# Patient Record
Sex: Female | Born: 1965 | Race: Black or African American | Hispanic: No | Marital: Married | Smoking: Never smoker
Health system: Southern US, Community
[De-identification: ages and names within clinical notes are randomized; demographics above are authoritative.]

## PROBLEM LIST (undated history)

## (undated) DIAGNOSIS — I10 Essential (primary) hypertension: Secondary | ICD-10-CM

## (undated) DIAGNOSIS — E785 Hyperlipidemia, unspecified: Secondary | ICD-10-CM

## (undated) HISTORY — PX: CARPAL TUNNEL RELEASE: SHX101

## (undated) HISTORY — PX: HYSTERECTOMY ABDOMINAL WITH SALPINGECTOMY: SHX6725

## (undated) HISTORY — PX: INTERCOSTAL NERVE BLOCK: SHX5021

## (undated) HISTORY — PX: TONSILLECTOMY AND ADENOIDECTOMY: SUR1326

---

## 2004-11-13 ENCOUNTER — Emergency Department: Payer: Self-pay | Admitting: Emergency Medicine

## 2008-01-27 ENCOUNTER — Observation Stay: Payer: Self-pay | Admitting: Internal Medicine

## 2008-01-27 ENCOUNTER — Other Ambulatory Visit: Payer: Self-pay

## 2008-11-29 ENCOUNTER — Ambulatory Visit: Payer: Self-pay | Admitting: Family Medicine

## 2009-04-08 ENCOUNTER — Emergency Department: Payer: Self-pay | Admitting: Emergency Medicine

## 2009-06-25 ENCOUNTER — Ambulatory Visit: Payer: Self-pay | Admitting: Specialist

## 2009-12-04 ENCOUNTER — Ambulatory Visit: Payer: Self-pay | Admitting: Family Medicine

## 2010-06-12 ENCOUNTER — Ambulatory Visit: Payer: Self-pay | Admitting: Specialist

## 2010-10-07 ENCOUNTER — Ambulatory Visit: Payer: Self-pay | Admitting: Otolaryngology

## 2010-11-03 ENCOUNTER — Ambulatory Visit: Payer: Self-pay | Admitting: Neurosurgery

## 2011-03-11 ENCOUNTER — Observation Stay: Payer: Self-pay | Admitting: Internal Medicine

## 2011-07-05 ENCOUNTER — Emergency Department: Payer: Self-pay | Admitting: Unknown Physician Specialty

## 2011-09-09 ENCOUNTER — Emergency Department: Payer: Self-pay | Admitting: Emergency Medicine

## 2011-09-23 ENCOUNTER — Emergency Department: Payer: Self-pay | Admitting: Unknown Physician Specialty

## 2012-04-13 ENCOUNTER — Emergency Department: Payer: Self-pay | Admitting: Emergency Medicine

## 2012-10-14 ENCOUNTER — Emergency Department: Payer: Self-pay | Admitting: Emergency Medicine

## 2012-10-14 LAB — URINALYSIS, COMPLETE
Ketone: NEGATIVE
Nitrite: NEGATIVE
Ph: 6 (ref 4.5–8.0)
Protein: NEGATIVE
Specific Gravity: 1.008 (ref 1.003–1.030)
Squamous Epithelial: 2

## 2012-10-14 LAB — BASIC METABOLIC PANEL
Anion Gap: 7 (ref 7–16)
BUN: 12 mg/dL (ref 7–18)
Calcium, Total: 9.2 mg/dL (ref 8.5–10.1)
Chloride: 106 mmol/L (ref 98–107)
Co2: 25 mmol/L (ref 21–32)
EGFR (Non-African Amer.): >60
Glucose: 83 mg/dL (ref 65–99)
Osmolality: 275 (ref 275–301)
Potassium: 3.7 mmol/L (ref 3.5–5.1)

## 2012-10-14 LAB — CBC
HGB: 13 g/dL (ref 12.0–16.0)
MCH: 31.7 pg (ref 26.0–34.0)
MCHC: 33.7 g/dL (ref 32.0–36.0)
MCV: 94 fL (ref 80–100)
Platelet: 278 10*3/uL (ref 150–440)
RBC: 4.11 10*6/uL (ref 3.80–5.20)
RDW: 14 % (ref 11.5–14.5)

## 2012-10-14 LAB — TROPONIN I
Troponin-I: <0.02 ng/mL
Troponin-I: <0.02 ng/mL

## 2012-10-14 LAB — CK TOTAL AND CKMB (NOT AT ARMC): CK, Total: 94 U/L (ref 21–215)

## 2014-05-22 ENCOUNTER — Emergency Department: Payer: Self-pay | Admitting: Emergency Medicine

## 2014-12-06 ENCOUNTER — Ambulatory Visit: Payer: Self-pay | Admitting: Family Medicine

## 2015-01-02 DIAGNOSIS — M5126 Other intervertebral disc displacement, lumbar region: Secondary | ICD-10-CM | POA: Insufficient documentation

## 2015-01-02 DIAGNOSIS — M5116 Intervertebral disc disorders with radiculopathy, lumbar region: Secondary | ICD-10-CM | POA: Insufficient documentation

## 2015-01-02 DIAGNOSIS — M5136 Other intervertebral disc degeneration, lumbar region: Secondary | ICD-10-CM | POA: Insufficient documentation

## 2015-09-25 ENCOUNTER — Other Ambulatory Visit: Payer: Self-pay | Admitting: Family Medicine

## 2015-09-25 DIAGNOSIS — Z1231 Encounter for screening mammogram for malignant neoplasm of breast: Secondary | ICD-10-CM

## 2015-10-08 ENCOUNTER — Ambulatory Visit
Admission: RE | Admit: 2015-10-08 | Discharge: 2015-10-08 | Disposition: A | Discharge status: Home / Self Care | Payer: Managed Care, Other (non HMO) | Source: Ambulatory Visit | Attending: Family Medicine | Admitting: Family Medicine

## 2015-10-08 DIAGNOSIS — Z1231 Encounter for screening mammogram for malignant neoplasm of breast: Secondary | ICD-10-CM | POA: Diagnosis not present

## 2016-01-31 ENCOUNTER — Ambulatory Visit (INDEPENDENT_AMBULATORY_CARE_PROVIDER_SITE_OTHER): Payer: Managed Care, Other (non HMO) | Admitting: Family Medicine

## 2016-01-31 ENCOUNTER — Encounter: Payer: Self-pay | Admitting: Family Medicine

## 2016-01-31 VITALS — BP 126/102 | HR 58 | Temp 98.3°F | Resp 16 | Wt 201.6 lb

## 2016-01-31 DIAGNOSIS — B349 Viral infection, unspecified: Secondary | ICD-10-CM | POA: Diagnosis not present

## 2016-01-31 DIAGNOSIS — J029 Acute pharyngitis, unspecified: Secondary | ICD-10-CM

## 2016-01-31 LAB — POC INFLUENZA A&B (BINAX/QUICKVUE)
INFLUENZA A, POC: NEGATIVE
Influenza B, POC: NEGATIVE

## 2016-01-31 LAB — POCT RAPID STREP A (OFFICE): RAPID STREP A SCREEN: NEGATIVE

## 2016-01-31 MED ORDER — OSELTAMIVIR PHOSPHATE 75 MG PO CAPS: 75.0000 mg | ORAL_CAPSULE | Freq: Two times a day (BID) | ORAL | Status: DC

## 2016-01-31 MED ORDER — AZITHROMYCIN 250 MG PO TABS: ORAL_TABLET | ORAL | Status: DC

## 2016-01-31 NOTE — Progress Notes (Signed)
Subjective:     Patient ID: Sherri Romero, female   DOB: 1966-06-08, 50 y.o.   MRN: [id number]  HPI  Chief Complaint  Patient presents with  . Sore Throat    Patient comes in office today with concerns of sore throat for the past 2 days, patient states that yesterday she had ran fever high of 101. Associated patient statres she has had congestion, pain with swallowing, spitting up bloody sputum and nose bleeds. Patient states that her household is sick with similar symptoms daughter initally had symptoms but was diagnosed with cold.   No flu shot due to prior anaphylactic reaction.  Reports significant body aches. Currently works at [employer].   Review of Systems     Objective:   Physical Exam  Constitutional: She appears well-developed and well-nourished. No distress.  Ears: T.M's intact without inflamma Throat: tonsils are absent with mild posterior pharyngeal erythema Neck: no cervical adenopathy Lungs: clear     Assessment:    1. Viral syndrome - oseltamivir (TAMIFLU) 75 MG capsule; Take 1 capsule (75 mg total) by mouth 2 (two) times daily.  Dispense: 10 capsule; Refill: 0 - POC Influenza A&B  2. Pharyngitis - azithromycin (ZITHROMAX Z-PAK) 250 MG tablet; Two pills the first day the one pill daily for four days  Dispense: 6 each; Refill: 0 - POCT rapid strep A    Plan:    discussed otc medication. To start Tamiflu in next 24 hours if significant cough. Work excuse for 2/17-2/19.

## 2016-01-31 NOTE — Patient Instructions (Addendum)
Discussed use of Mucinex D and Delsym for cough. Try ibuprofen or salt water gargles for sore throat. Start Tamiflu if significant cough in the next 24 hours.

## 2016-06-10 ENCOUNTER — Emergency Department
Admission: EM | Admit: 2016-06-10 | Discharge: 2016-06-10 | Disposition: A | Discharge status: Home / Self Care | Payer: Managed Care, Other (non HMO) | Attending: Emergency Medicine | Admitting: Emergency Medicine

## 2016-06-10 ENCOUNTER — Encounter: Payer: Self-pay | Admitting: Emergency Medicine

## 2016-06-10 DIAGNOSIS — I10 Essential (primary) hypertension: Secondary | ICD-10-CM | POA: Insufficient documentation

## 2016-06-10 DIAGNOSIS — Z952 Presence of prosthetic heart valve: Secondary | ICD-10-CM | POA: Insufficient documentation

## 2016-06-10 DIAGNOSIS — R55 Syncope and collapse: Secondary | ICD-10-CM | POA: Diagnosis present

## 2016-06-10 DIAGNOSIS — M5136 Other intervertebral disc degeneration, lumbar region: Secondary | ICD-10-CM | POA: Diagnosis not present

## 2016-06-10 HISTORY — DX: Essential (primary) hypertension: I10

## 2016-06-10 LAB — BASIC METABOLIC PANEL
Anion gap: 7 (ref 5–15)
BUN: 9 mg/dL (ref 6–20)
CHLORIDE: 105 mmol/L (ref 101–111)
CO2: 24 mmol/L (ref 22–32)
CREATININE: 0.81 mg/dL (ref 0.44–1.00)
Calcium: 8.9 mg/dL (ref 8.9–10.3)
GFR calc non Af Amer: >60 mL/min (ref >60)
Glucose, Bld: 118 mg/dL — ABNORMAL HIGH (ref 65–99)
POTASSIUM: 3.4 mmol/L — AB (ref 3.5–5.1)
Sodium: 136 mmol/L (ref 135–145)

## 2016-06-10 LAB — URINALYSIS COMPLETE WITH MICROSCOPIC (ARMC ONLY)
BILIRUBIN URINE: NEGATIVE
Bacteria, UA: NONE SEEN
GLUCOSE, UA: NEGATIVE mg/dL
Ketones, ur: NEGATIVE mg/dL
Leukocytes, UA: NEGATIVE
NITRITE: NEGATIVE
Protein, ur: NEGATIVE mg/dL
SPECIFIC GRAVITY, URINE: 1.001 — AB (ref 1.005–1.030)
Squamous Epithelial / LPF: NONE SEEN
pH: 7 (ref 5.0–8.0)

## 2016-06-10 LAB — CBC WITH DIFFERENTIAL/PLATELET
Basophils Absolute: 0.1 10*3/uL (ref 0–0.1)
Basophils Relative: 1 %
EOS PCT: 4 %
Eosinophils Absolute: 0.4 10*3/uL (ref 0–0.7)
HEMATOCRIT: 37.3 % (ref 35.0–47.0)
Hemoglobin: 12.9 g/dL (ref 12.0–16.0)
LYMPHS ABS: 3.9 10*3/uL — AB (ref 1.0–3.6)
LYMPHS PCT: 43 %
MCH: 32.1 pg (ref 26.0–34.0)
MCHC: 34.6 g/dL (ref 32.0–36.0)
MCV: 92.8 fL (ref 80.0–100.0)
Monocytes Absolute: 0.7 10*3/uL (ref 0.2–0.9)
Monocytes Relative: 8 %
NEUTROS ABS: 3.9 10*3/uL (ref 1.4–6.5)
Neutrophils Relative %: 44 %
PLATELETS: 261 10*3/uL (ref 150–440)
RBC: 4.02 MIL/uL (ref 3.80–5.20)
RDW: 13.5 % (ref 11.5–14.5)
WBC: 8.9 10*3/uL (ref 3.6–11.0)

## 2016-06-10 LAB — TROPONIN I
Troponin I: <0.03 ng/mL (ref <0.03)
Troponin I: <0.03 ng/mL (ref <0.03)

## 2016-06-10 LAB — POCT RAPID STREP A: Streptococcus, Group A Screen (Direct): NEGATIVE

## 2016-06-10 NOTE — Discharge Instructions (Signed)
You were evaluated after nearly passing out, and your exam and evaluation are reassuring in the emergency department today.  Return to emergency department immediately for any new or worsening condition including chest pain, palpitations, dizziness or passing out, weakness or numbness, confusion or altered mental status, or any other symptoms concerning to you.   Near-Syncope Near-syncope (commonly known as near fainting) is sudden weakness, dizziness, or feeling like you might pass out. This can happen when getting up or while standing for a long time. It is caused by a sudden decrease in blood flow to the brain, which can occur for various reasons. Most of the reasons are not serious.  HOME CARE Watch your condition for any changes.  Have someone stay with you until you feel stable.  If you feel like you are going to pass out:  Lie down right away.  Prop your feet up if you can.  Breathe deeply and steadily.  Move only when the feeling has gone away. Most of the time, this feeling lasts only a few minutes. You may feel tired for several hours.  Drink enough fluids to keep your pee (urine) clear or pale yellow.  If you are taking blood pressure or heart medicine, stand up slowly.  Follow up with your doctor as told. GET HELP RIGHT AWAY IF:   You have a severe headache.  You have unusual pain in the chest, belly (abdomen), or back.  You have bleeding from the mouth or butt (rectum), or you have black or tarry poop (stool).  You feel your heart beat differently than normal, or you have a very fast pulse.  You pass out, or you twitch and shake when you pass out.  You pass out when sitting or lying down.  You feel confused.  You have trouble walking.  You are weak.  You have vision problems. MAKE SURE YOU:   Understand these instructions.  Will watch your condition.  Will get help right away if you are not doing well or get worse.   This information is not  intended to replace advice given to you by your health care provider. Make sure you discuss any questions you have with your health care provider.   Document Released: 05/18/2008 Document Revised: 12/21/2014 Document Reviewed: 05/05/2013 Elsevier Interactive Patient Education [employer]2016 Elsevier Inc.

## 2016-06-10 NOTE — ED Notes (Signed)
Per [facility], patient comes from home with c/o dizziness/lightheadedness. Patient had a near syncopal episode. EMS got a BP of 200/100, patient was on BP medication but was taken off last year due to her blood pressure being "okay". EMS gave 4 of zofran for nausea. During the episode of dizziness patient c/o CP that lasted 1 minute. Patient is A&O x4. NSR on monitor.

## 2016-06-10 NOTE — ED Provider Notes (Signed)
[facility] Emergency Department Provider Note   ____________________________________________  Time seen: Approximately 3:25 PM I have reviewed the triage vital signs and the triage nursing note.  HISTORY  Chief Complaint Near Syncope   Historian Patient  HPI Sherri Romero is a 50 y.o. female with a history of mitral valve prolapse and prior hypertension although she's been off medication for a year because she reports she had been doing well and blood pressure medication had been discontinued, who is here for evaluation after an episode at home of near syncope.  Patient states she's been feeling well with no sinus congestion, or recent illnesses, had finished washing dishes where she had been standing was going to make herself a milkshake when she had acute onset of lightheadedness and then describes the counter looked like a wave but not necessarily spinning, and then her vision became dim and she felt weak all over. No focal weakness or numbness or speech problems. She crawled on the floor and called 911. She states that she was having some central chest pressure and trouble breathing. She was still feeling bad when EMS arrived there, but she feels better now.  Denies risk factors for dehydration at this point time.    She states that she has passed on the past, but it didn't feel similar to this episode.    Past Medical History  Diagnosis Date  . Hypertension   . Leaky heart valve     Patient Active Problem List   Diagnosis Date Noted  . Bulge of lumbar disc without myelopathy 01/02/2015  . Neuritis or radiculitis due to rupture of lumbar intervertebral disc 01/02/2015    Past Surgical History  Procedure Laterality Date  . Intercostal nerve block    . Carpal tunnel release    . Tonsillectomy and adenoidectomy    . Hysterectomy abdominal with salpingectomy      Current Outpatient Rx  Name  Route  Sig  Dispense  Refill  . azithromycin  (ZITHROMAX Z-PAK) 250 MG tablet      Two pills the first day the one pill daily for four days   6 each   0   . oseltamivir (TAMIFLU) 75 MG capsule   Oral   Take 1 capsule (75 mg total) by mouth 2 (two) times daily.   10 capsule   0     Allergies Penicillins and Cephalosporins  Family History  Problem Relation Age of Onset  . Breast cancer Sister 2041    Social History Social History  Substance Use Topics  . Smoking status: Never Smoker   . Smokeless tobacco: None  . Alcohol Use: No    Review of Systems  Constitutional: Negative for fever. Eyes: Negative for visual changes. ENT: Negative for sore throat. Cardiovascular: Negative for Pleuritic chest pain. Positive for chest pressure episode, now resolved. Respiratory: Negative for shortness of breath at present, during the episode she felt little short of breath. Gastrointestinal: Negative for abdominal pain, vomiting and diarrhea. Genitourinary: Negative for dysuria. Musculoskeletal: Negative for back pain. Skin: Negative for rash. Neurological: Negative for headache. 10 point Review of Systems otherwise negative ____________________________________________   PHYSICAL EXAM:  VITAL SIGNS: ED Triage Vitals  Enc Vitals Group     BP 06/10/16 1456 206/107 mmHg     Pulse Rate 06/10/16 1456 65     Resp 06/10/16 1456 17     Temp --      Temp src --      SpO2 06/10/16  1456 99 %     Weight 06/10/16 1456 200 lb (90.719 kg)     Height 06/10/16 1456  (1.6 m)     Head Cir --      Peak Flow --      Pain Score --      Pain Loc --      Pain Edu? --      Excl. in GC? --      Constitutional: Alert and oriented. Well appearing and in no distress. HEENT   Head: Normocephalic and atraumatic.      Eyes: Conjunctivae are normal. PERRL. Normal extraocular movements.      Ears:         Nose: No congestion/rhinnorhea.   Mouth/Throat: Mucous membranes are moist.   Neck: No stridor. Cardiovascular/Chest:  Normal rate, regular rhythm.  No murmurs, rubs, or gallops. Respiratory: Normal respiratory effort without tachypnea nor retractions. Breath sounds are clear and equal bilaterally. No wheezes/rales/rhonchi. Gastrointestinal: Soft. No distention, no guarding, no rebound. Nontender.    Genitourinary/rectal:Deferred Musculoskeletal: Nontender with normal range of motion in all extremities. No joint effusions.  No lower extremity tenderness.  No edema. Neurologic:  Normal speech and language. No facial droop. Coordination intact. No gross or focal neurologic deficits are appreciated. Skin:  Skin is warm, dry and intact. No rash noted. Psychiatric: Mood and affect are normal. Speech and behavior are normal. Patient exhibits appropriate insight and judgment.  ____________________________________________   EKG I, Governor Rooks, MD, the attending physician have personally viewed and interpreted all ECGs.  60 bpm. Normal sinus rhythm. Narrow QRS normal axis. Normal ST and T-wave ____________________________________________  LABS (pertinent positives/negatives)  Labs Reviewed  URINALYSIS COMPLETEWITH MICROSCOPIC ([facility] ONLY) - Abnormal; Notable for the following:    Color, Urine COLORLESS (*)    APPearance CLEAR (*)    Specific Gravity, Urine 1.001 (*)    Hgb urine dipstick 1+ (*)    All other components within normal limits  BASIC METABOLIC PANEL - Abnormal; Notable for the following:    Potassium 3.4 (*)    Glucose, Bld 118 (*)    All other components within normal limits  CBC WITH DIFFERENTIAL/PLATELET - Abnormal; Notable for the following:    Lymphs Abs 3.9 (*)    All other components within normal limits  CULTURE, GROUP A STREP [facility])  TROPONIN I  TROPONIN I  POCT RAPID STREP A    ____________________________________________  RADIOLOGY All Xrays were viewed by me. Imaging interpreted by Radiologist.  None __________________________________________  PROCEDURES  Procedure(s)  performed: None  Critical Care performed: None  ____________________________________________   ED COURSE / ASSESSMENT AND PLAN  Pertinent labs & imaging results that were available during my care of the patient were reviewed by me and considered in my medical decision making (see chart for details).   The way the patient describes the episode it sounds like episode of hypotension causing near syncope, but unsure cause.  Doesn't exactly sound like vertigo, and it is not reproducible here in the ER. She is did not describe recent pulmonary symptoms, and she is not having shortness of breath or any hypoxia here. She had no palpitations and has no cardiac history, but did describe some chest pressure. Her EKG is unremarkable.  Her symptoms do not sound like stroke or TIA. Her neurologic exam is intact. She is not describing a focal neurologic complaint. There was some dizziness, but it was not described as a coordination issue, but decreased vision and generalized  weakness.  I discussed with her my low suspicion for her symptoms having been related to stroke or TIA and we elected not to pursue head imaging at this point time.  I will repeat her troponin, now is been 3 hours. She is also stating that she has a bit of sore throat and I'm going to check a rapid strep because she states that she's had strep frequently and this feels similar.  Ultimately she feels much better and her blood pressure is down to about 160/80. Her pressures had recently been in the 120s systolic and I will not start on any medication at this point time.  I'm going to ask her to follow up with her primary care physician in the next 1-2 days for close follow-up.    CONSULTATIONS:   None   Patient / Family / Caregiver informed of clinical course, medical decision-making process, and agree with plan.   I discussed return precautions, follow-up instructions, and discharged instructions with patient and/or  family.   ___________________________________________   FINAL CLINICAL IMPRESSION(S) / ED DIAGNOSES   Final diagnoses:  Near syncope              Note: This dictation was prepared with Dragon dictation. Any transcriptional errors that result from this process are unintentional   Governor Rooksebecca Jaeveon [place], MD 06/10/16 1921

## 2016-06-12 ENCOUNTER — Other Ambulatory Visit
Admission: RE | Admit: 2016-06-12 | Discharge: 2016-06-12 | Disposition: A | Discharge status: Home / Self Care | Payer: Managed Care, Other (non HMO) | Source: Ambulatory Visit | Attending: Family Medicine | Admitting: Family Medicine

## 2016-06-12 ENCOUNTER — Ambulatory Visit
Admission: RE | Admit: 2016-06-12 | Discharge: 2016-06-12 | Disposition: A | Discharge status: Home / Self Care | Payer: Managed Care, Other (non HMO) | Source: Ambulatory Visit | Attending: Family Medicine | Admitting: Family Medicine

## 2016-06-12 ENCOUNTER — Encounter: Payer: Self-pay | Admitting: Family Medicine

## 2016-06-12 ENCOUNTER — Ambulatory Visit (INDEPENDENT_AMBULATORY_CARE_PROVIDER_SITE_OTHER): Payer: Managed Care, Other (non HMO) | Admitting: Family Medicine

## 2016-06-12 VITALS — BP 168/100 | HR 60 | Temp 98.4°F | Resp 18 | Wt 195.8 lb

## 2016-06-12 DIAGNOSIS — R42 Dizziness and giddiness: Secondary | ICD-10-CM | POA: Diagnosis not present

## 2016-06-12 DIAGNOSIS — IMO0001 Reserved for inherently not codable concepts without codable children: Secondary | ICD-10-CM

## 2016-06-12 DIAGNOSIS — R030 Elevated blood-pressure reading, without diagnosis of hypertension: Secondary | ICD-10-CM

## 2016-06-12 DIAGNOSIS — R55 Syncope and collapse: Secondary | ICD-10-CM | POA: Diagnosis not present

## 2016-06-12 DIAGNOSIS — I10 Essential (primary) hypertension: Secondary | ICD-10-CM | POA: Insufficient documentation

## 2016-06-12 LAB — POTASSIUM: POTASSIUM: 3.7 mmol/L (ref 3.5–5.1)

## 2016-06-12 LAB — TSH: TSH: 1.427 u[IU]/mL (ref 0.350–4.500)

## 2016-06-12 MED ORDER — AMLODIPINE BESYLATE 2.5 MG PO TABS: 2.5000 mg | ORAL_TABLET | Freq: Every day | ORAL | Status: DC

## 2016-06-12 NOTE — Progress Notes (Signed)
Name: Sherri Romero   MRN: [id number]    DOB: January 20, 1966   Date:06/12/2016       Progress Note  Subjective  Chief Complaint  Chief Complaint  Patient presents with  . Follow-up    patient is here for a ER f/u. patient stated that she is still a little bit light-headed. she tried to go to work today and did not feel right. she stated that it happened on yesterday while she was at home making a milkshake.    HPI  Elevated BP and dizziness: she was in her kitchen making a milk shake two days ago and developed a sudden sensation of movement, she crawl down and checked her bp that was very high ( 179/?) she felt some palpitation and a change in her breathing so she called 911. She was transported to [facility] and labs were normal except for potassium that was slightly low. BP was still high at [facility] 206/107 upon arrival and went down to 160's ( per patient ) before she went home. She went back to work but states she still does not feel normal, head feels light, also has some nausea, but no longer has palpitation   Patient Active Problem List   Diagnosis Date Noted  . Essential hypertension 06/12/2016  . Bulge of lumbar disc without myelopathy 01/02/2015  . Neuritis or radiculitis due to rupture of lumbar intervertebral disc 01/02/2015    Past Surgical History  Procedure Laterality Date  . Intercostal nerve block    . Carpal tunnel release    . Tonsillectomy and adenoidectomy    . Hysterectomy abdominal with salpingectomy      Family History  Problem Relation Age of Onset  . Breast cancer Sister 101    Social History   Social History  . Marital Status: Married    Spouse Name: N/A  . Number of Children: N/A  . Years of Education: N/A   Occupational History  . Not on file.   Social History Main Topics  . Smoking status: Never Smoker   . Smokeless tobacco: Not on file  . Alcohol Use: No  . Drug Use: No  . Sexual Activity: Not on file   Other Topics Concern  . Not on file    Social History Narrative     Current outpatient prescriptions:  .  amLODipine (NORVASC) 2.5 MG tablet, Take 1-2 tablets (2.5-5 mg total) by mouth daily., Disp: 30 tablet, Rfl: 0  Allergies  Allergen Reactions  . Penicillins Anaphylaxis and Shortness Of Breath  . Cephalosporins     Other reaction(s): Unknown     ROS  Ten systems reviewed and is negative except as mentioned in HPI   Objective  Filed Vitals:   06/12/16 1008  BP: 158/100  Pulse: 60  Temp: 98.4 F (36.9 C)  TempSrc: Oral  Resp: 18  Weight: 195 lb 12.8 oz (88.814 kg)  SpO2: 98%    Body mass index is 34.69 kg/(m^2).  Physical Exam  Constitutional: Patient appears well-developed and well-nourished. Obese No distress.  HEENT: head atraumatic, normocephalic, pupils equal and reactive to light,neck supple, throat within normal limits Cardiovascular: Normal rate, regular rhythm and normal heart sounds.  No murmur heard. No BLE edema. No carotid bruit Pulmonary/Chest: Effort normal and breath sounds normal. No respiratory distress. Abdominal: Soft.  There is no tenderness. Psychiatric: Patient has a normal mood and affect. behavior is normal. Judgment and thought content normal. Neurological: cranial nerve intact, normal sensation, romberg negative - but  felt dizzy with eyes closed  Recent Results (from the past 2160 hour(s))  Basic metabolic panel     Status: Abnormal   Collection Time: 06/10/16  3:00 PM  Result Value Ref Range   Sodium 136 135 - 145 mmol/L   Potassium 3.4 (L) 3.5 - 5.1 mmol/L   Chloride 105 101 - 111 mmol/L   CO2 24 22 - 32 mmol/L   Glucose, Bld 118 (H) 65 - 99 mg/dL   BUN 9 6 - 20 mg/dL   Creatinine, Ser 0.81 0.44 - 1.00 mg/dL   Calcium 8.9 8.9 - 10.3 mg/dL   GFR calc non Af Amer >60 >60 mL/min   GFR calc Af Amer >60 >60 mL/min    Comment: (NOTE) The eGFR has been calculated using the CKD EPI equation. This calculation has not been validated in all clinical situations. eGFR's  persistently <60 mL/min signify possible Chronic Kidney Disease.    Anion gap 7 5 - 15  Troponin I     Status: None   Collection Time: 06/10/16  3:00 PM  Result Value Ref Range   Troponin I <0.03 <0.03 ng/mL  CBC with Differential     Status: Abnormal   Collection Time: 06/10/16  3:00 PM  Result Value Ref Range   WBC 8.9 3.6 - 11.0 K/uL   RBC 4.02 3.80 - 5.20 MIL/uL   Hemoglobin 12.9 12.0 - 16.0 g/dL   HCT 37.3 35.0 - 47.0 %   MCV 92.8 80.0 - 100.0 fL   MCH 32.1 26.0 - 34.0 pg   MCHC 34.6 32.0 - 36.0 g/dL   RDW 13.5 11.5 - 14.5 %   Platelets 261 150 - 440 K/uL   Neutrophils Relative % 44 %   Neutro Abs 3.9 1.4 - 6.5 K/uL   Lymphocytes Relative 43 %   Lymphs Abs 3.9 (H) 1.0 - 3.6 K/uL   Monocytes Relative 8 %   Monocytes Absolute 0.7 0.2 - 0.9 K/uL   Eosinophils Relative 4 %   Eosinophils Absolute 0.4 0 - 0.7 K/uL   Basophils Relative 1 %   Basophils Absolute 0.1 0 - 0.1 K/uL  Urinalysis complete, with microscopic     Status: Abnormal   Collection Time: 06/10/16  4:08 PM  Result Value Ref Range   Color, Urine COLORLESS (A) YELLOW   APPearance CLEAR (A) CLEAR   Glucose, UA NEGATIVE NEGATIVE mg/dL   Bilirubin Urine NEGATIVE NEGATIVE   Ketones, ur NEGATIVE NEGATIVE mg/dL   Specific Gravity, Urine 1.001 (L) 1.005 - 1.030   Hgb urine dipstick 1+ (A) NEGATIVE   pH 7.0 5.0 - 8.0   Protein, ur NEGATIVE NEGATIVE mg/dL   Nitrite NEGATIVE NEGATIVE   Leukocytes, UA NEGATIVE NEGATIVE   RBC / HPF 0-5 0 - 5 RBC/hpf   WBC, UA 0-5 0 - 5 WBC/hpf   Bacteria, UA NONE SEEN NONE SEEN   Squamous Epithelial / LPF NONE SEEN NONE SEEN  Troponin I     Status: None   Collection Time: 06/10/16  6:02 PM  Result Value Ref Range   Troponin I <0.03 <0.03 ng/mL  POCT rapid strep A [facility])     Status: None   Collection Time: 06/10/16  6:12 PM  Result Value Ref Range   Streptococcus, Group A Screen (Direct) NEGATIVE NEGATIVE  Culture, group A strep     Status: None (Preliminary result)    Collection Time: 06/10/16  7:04 PM  Result Value Ref Range   Specimen  Description THROAT    Special Requests NONE    Culture      CULTURE REINCUBATED FOR BETTER GROWTH Performed at [facility]    Report Status PENDING      PHQ2/9: Depression screen [facility] 2/9 06/12/2016  Decreased Interest 0  Down, Depressed, Hopeless 0  PHQ - 2 Score 0    Fall Risk: Fall Risk  06/12/2016  Falls in the past year? No     Functional Status Survey: Is the patient deaf or have difficulty hearing?: No Does the patient have difficulty seeing, even when wearing glasses/contacts?: Yes (patient stated since this episode she has been having a haze over her eyes.) Does the patient have difficulty concentrating, remembering, or making decisions?: No (patient stated that she has been resting a lot so she has not noticed any changes ) Does the patient have difficulty walking or climbing stairs?: No Does the patient have difficulty dressing or bathing?: No Does the patient have difficulty doing errands alone such as visiting a doctor's office or shopping?: Yes    Assessment & Plan  1. Elevated blood pressure  Remote history HTN, off medication for many years. BP at home around 120's80's until till this week and has been elevated since Wednesday.  We will check for secondary causes, we will hold off on HCTZ because of hypokalemia, try Norvasc and increase dose as tolerated. Depending on results refer to Nephrologist.  - TSH - Potassium - Catecholamines, fractionated, plasma - amLODipine (NORVASC) 2.5 MG tablet; Take 1-2 tablets (2.5-5 mg total) by mouth daily.  Dispense: 30 tablet; Refill: 0 - CT Head Wo Contrast; Future  2. Dizziness  - CT Head Wo Contrast; Future  3. Pre-syncope  Go back to [facility] if changing or worsening of symptoms  - CT Head Wo Contrast; Future

## 2016-06-12 NOTE — Addendum Note (Signed)
Addended by: Phineas SemenJOHNSON, Markan Cazarez L on: 06/12/2016 10:40 AM   Modules accepted: Orders

## 2016-06-13 LAB — CULTURE, GROUP A STREP (THRC)

## 2016-06-15 LAB — MISC LABCORP TEST (SEND OUT): LABCORP TEST CODE: 84152

## 2016-06-24 ENCOUNTER — Other Ambulatory Visit: Payer: Self-pay | Admitting: Family Medicine

## 2016-06-24 NOTE — Telephone Encounter (Signed)
Patient requesting refill. 

## 2016-08-05 ENCOUNTER — Other Ambulatory Visit: Payer: Self-pay | Admitting: Family Medicine

## 2016-08-06 NOTE — Telephone Encounter (Signed)
Is there any opening for today

## 2016-08-06 NOTE — Telephone Encounter (Signed)
Patient requesting refill of Norvasc be sent [facility] [phone]

## 2016-08-06 NOTE — Telephone Encounter (Signed)
LMOM for pt to call office to schedule an appt.

## 2016-08-06 NOTE — Telephone Encounter (Signed)
There is nothing available for the rest of the week. She is booked into September

## 2016-08-06 NOTE — Telephone Encounter (Signed)
You have no appointments until September.

## 2016-08-06 NOTE — Telephone Encounter (Signed)
Can she come in today? I need to know if medication is working

## 2016-09-02 ENCOUNTER — Other Ambulatory Visit: Payer: Self-pay | Admitting: Family Medicine

## 2016-09-02 NOTE — Telephone Encounter (Signed)
Patient called back and scheduled appt for 09/15/16

## 2016-09-02 NOTE — Telephone Encounter (Signed)
Requested patient to return call

## 2016-09-15 ENCOUNTER — Ambulatory Visit (INDEPENDENT_AMBULATORY_CARE_PROVIDER_SITE_OTHER): Payer: Managed Care, Other (non HMO) | Admitting: Family Medicine

## 2016-09-15 ENCOUNTER — Encounter: Payer: Self-pay | Admitting: Family Medicine

## 2016-09-15 VITALS — BP 182/92 | HR 77 | Temp 99.3°F | Resp 16 | Ht 63.0 in | Wt 199.0 lb

## 2016-09-15 DIAGNOSIS — M5126 Other intervertebral disc displacement, lumbar region: Secondary | ICD-10-CM | POA: Diagnosis not present

## 2016-09-15 DIAGNOSIS — Z1211 Encounter for screening for malignant neoplasm of colon: Secondary | ICD-10-CM | POA: Diagnosis not present

## 2016-09-15 DIAGNOSIS — Z131 Encounter for screening for diabetes mellitus: Secondary | ICD-10-CM | POA: Diagnosis not present

## 2016-09-15 DIAGNOSIS — M5136 Other intervertebral disc degeneration, lumbar region: Secondary | ICD-10-CM

## 2016-09-15 DIAGNOSIS — R55 Syncope and collapse: Secondary | ICD-10-CM

## 2016-09-15 DIAGNOSIS — R600 Localized edema: Secondary | ICD-10-CM

## 2016-09-15 DIAGNOSIS — I341 Nonrheumatic mitral (valve) prolapse: Secondary | ICD-10-CM

## 2016-09-15 DIAGNOSIS — Z13220 Encounter for screening for lipoid disorders: Secondary | ICD-10-CM | POA: Diagnosis not present

## 2016-09-15 DIAGNOSIS — I10 Essential (primary) hypertension: Secondary | ICD-10-CM

## 2016-09-15 DIAGNOSIS — R42 Dizziness and giddiness: Secondary | ICD-10-CM | POA: Diagnosis not present

## 2016-09-15 MED ORDER — HYDROCHLOROTHIAZIDE 12.5 MG PO TABS: 12.5000 mg | ORAL_TABLET | Freq: Every day | ORAL | 1 refills | Status: DC

## 2016-09-15 NOTE — Progress Notes (Signed)
Name: Sherri Romero   MRN: [id number]    DOB: 08/21/1966   Date:09/15/2016       Progress Note  Subjective  Chief Complaint  Chief Complaint  Patient presents with  . elevated blood pressure  . Medication Refill    HPI  Recurrent syncopal episodes: looking back on her chart, CT done in 2009 for same symptoms. It has been a recurrent problem, [facility], bp was very hight, CT head negative. She has been taking Norvasc one daily but makes too sleepy - so she did not take it this morning. She continues to have a sensation of movement, such as the ground moving while she is driving, but no spinning sensation, she has associated nausea at times and sometimes symptoms are associated with severe headache  HTN uncontrolled: last catecholamines showed mild elevation of epinephrine, we will recheck level, normal TSH and CBC a few months ago. She is taking Norvasc but skipped today's dose, she states bp had improved, but has not checked it recently  Low back pain with radiculitis: seeing Dr. Council Mechanichasniss and had steroid injection last week and still has pain and radiculitis down right leg. Taking hydrocodone prn.   Lower extremity edema: keeping leg elevated and using compression stocking hoses and symptoms have improved. No orthopnea and only has SOB with moderate activity ( running up a flight of stairs )  Patient Active Problem List   Diagnosis Date Noted  . Mitral valve prolapse 09/15/2016  . Colon cancer screening 09/15/2016  . Essential hypertension 06/12/2016  . Bulge of lumbar disc without myelopathy 01/02/2015  . Neuritis or radiculitis due to rupture of lumbar intervertebral disc 01/02/2015    Past Surgical History:  Procedure Laterality Date  . CARPAL TUNNEL RELEASE    . HYSTERECTOMY ABDOMINAL WITH SALPINGECTOMY    . INTERCOSTAL NERVE BLOCK    . TONSILLECTOMY AND ADENOIDECTOMY      Family History  Problem Relation Age of Onset  . Breast cancer Sister 2641     Social History   Social History  . Marital status: Married    Spouse name: N/A  . Number of children: N/A  . Years of education: N/A   Occupational History  . Not on file.   Social History Main Topics  . Smoking status: Never Smoker  . Smokeless tobacco: Never Used  . Alcohol use No  . Drug use: No  . Sexual activity: Not Currently   Other Topics Concern  . Not on file   Social History Narrative  . No narrative on file     Current Outpatient Prescriptions:  .  amLODipine (NORVASC) 5 MG tablet, TAKE 1/2 TO 1 TABLET BY MOUTH ONCE A DAY, Disp: 30 tablet, Rfl: 0 .  hydrochlorothiazide (HYDRODIURIL) 12.5 MG tablet, Take 1 tablet (12.5 mg total) by mouth daily., Disp: 30 tablet, Rfl: 1 .  HYDROcodone-acetaminophen (NORCO) 10-325 MG tablet, , Disp: , Rfl:   Allergies  Allergen Reactions  . Penicillins Anaphylaxis and Shortness Of Breath  . Cephalosporins     Other reaction(s): Unknown     ROS  Ten systems reviewed and is negative except as mentioned in HPI   Objective  Vitals:   09/15/16 0848  BP: (!) 182/92  Pulse: 77  Resp: 16  Temp: 99.3 F (37.4 C)  SpO2: 97%  Weight: 199 lb (90.3 kg)  Height: 5\' 3"  (1.6 m)    Body mass index is 35.25 kg/m.  Physical Exam  Constitutional: Patient appears well-developed and well-nourished. Obese No distress.  HEENT: head atraumatic, normocephalic, pupils equal and reactive to light,  neck supple, throat within normal limits Cardiovascular: Normal rate, regular rhythm and normal heart sounds.  No murmur heard. 1 plus BLE edema. Pulmonary/Chest: Effort normal and breath sounds normal. No respiratory distress. Abdominal: Soft.  There is no tenderness. Psychiatric: Patient has a normal mood and affect. behavior is normal. Judgment and thought content normal.   PHQ2/9: Depression screen [facility] 2/9 09/15/2016 06/12/2016  Decreased Interest 0 0  Down, Depressed, Hopeless 0 0  PHQ - 2 Score 0 0    Fall Risk: Fall  Risk  09/15/2016 06/12/2016  Falls in the past year? No No    Functional Status Survey: Is the patient deaf or have difficulty hearing?: No Does the patient have difficulty seeing, even when wearing glasses/contacts?: No Does the patient have difficulty concentrating, remembering, or making decisions?: No Does the patient have difficulty walking or climbing stairs?: No Does the patient have difficulty dressing or bathing?: No Does the patient have difficulty doing errands alone such as visiting a doctor's office or shopping?: No    Assessment & Plan  1. Mitral valve prolapse  Stable, no chest pain or palpitation  2. Uncontrolled hypertension  Not at goal, recheck labs and add hctz - hydrochlorothiazide (HYDRODIURIL) 12.5 MG tablet; Take 1 tablet (12.5 mg total) by mouth daily.  Dispense: 30 tablet; Refill: 1 - COMPLETE METABOLIC PANEL WITH GFR - Catecholamines, fractionated, plasma  3. Bulge of lumbar disc without myelopathy  Continue follow up Dr. Council Mechanic  4. Dizziness  - Ambulatory referral to Neurology  5. Syncope and collapse  - Ambulatory referral to Neurology  6. Colon cancer screening  - Ambulatory referral to Gastroenterology  7. Lipid screening  - Lipid panel  8. Screening for diabetes mellitus  - Hemoglobin A1c   9. Lower extremity edema  Doing well with compression stocking hoses

## 2016-09-16 LAB — LIPID PANEL
Cholesterol: 207 mg/dL — ABNORMAL HIGH (ref 125–200)
HDL: 79 mg/dL (ref >=46)
LDL CALC: 118 mg/dL (ref <130)
TRIGLYCERIDES: 51 mg/dL (ref <150)
Total CHOL/HDL Ratio: 2.6 Ratio (ref <=5.0)
VLDL: 10 mg/dL (ref <30)

## 2016-09-16 LAB — COMPLETE METABOLIC PANEL WITH GFR
ALBUMIN: 3.8 g/dL (ref 3.6–5.1)
ALK PHOS: 57 U/L (ref 33–130)
ALT: 12 U/L (ref 6–29)
AST: 13 U/L (ref 10–35)
BILIRUBIN TOTAL: 0.6 mg/dL (ref 0.2–1.2)
BUN: 8 mg/dL (ref 7–25)
CALCIUM: 9 mg/dL (ref 8.6–10.4)
CO2: 28 mmol/L (ref 20–31)
Chloride: 103 mmol/L (ref 98–110)
Creat: 0.9 mg/dL (ref 0.50–1.05)
GFR, EST AFRICAN AMERICAN: 86 mL/min (ref >=60)
GFR, EST NON AFRICAN AMERICAN: 75 mL/min (ref >=60)
Glucose, Bld: 96 mg/dL (ref 65–99)
Potassium: 4.3 mmol/L (ref 3.5–5.3)
Sodium: 138 mmol/L (ref 135–146)
TOTAL PROTEIN: 6.6 g/dL (ref 6.1–8.1)

## 2016-09-17 LAB — HEMOGLOBIN A1C
HEMOGLOBIN A1C: 5.8 % — AB (ref <5.7)
MEAN PLASMA GLUCOSE: 120 mg/dL

## 2016-09-20 ENCOUNTER — Other Ambulatory Visit: Payer: Self-pay | Admitting: Family Medicine

## 2016-09-20 MED ORDER — ATORVASTATIN CALCIUM 40 MG PO TABS: 40.0000 mg | ORAL_TABLET | Freq: Every day | ORAL | 3 refills | Status: DC

## 2016-09-21 LAB — CATECHOLAMINES, FRACTIONATED, PLASMA
Catecholamines, Total: 540 pg/mL
EPINEPHRINE: 39 pg/mL
Norepinephrine: 501 pg/mL

## 2016-09-23 ENCOUNTER — Other Ambulatory Visit: Payer: Self-pay | Admitting: Family Medicine

## 2016-09-23 NOTE — Telephone Encounter (Signed)
Patient requesting refill of Amlodipine to [facility].  

## 2016-10-19 ENCOUNTER — Other Ambulatory Visit: Payer: Self-pay

## 2016-10-19 DIAGNOSIS — I10 Essential (primary) hypertension: Secondary | ICD-10-CM

## 2016-10-19 MED ORDER — HYDROCHLOROTHIAZIDE 12.5 MG PO TABS: 12.5000 mg | ORAL_TABLET | Freq: Every day | ORAL | 0 refills | Status: DC

## 2016-10-19 MED ORDER — ATORVASTATIN CALCIUM 40 MG PO TABS: 40.0000 mg | ORAL_TABLET | Freq: Every day | ORAL | 0 refills | Status: DC

## 2016-10-19 NOTE — Telephone Encounter (Addendum)
Patient requesting refill of Atorvastatin and HCTZ to [facility] for a 90 day supply.

## 2016-10-20 ENCOUNTER — Other Ambulatory Visit: Payer: Self-pay

## 2016-10-20 MED ORDER — AMLODIPINE BESYLATE 5 MG PO TABS: ORAL_TABLET | ORAL | 0 refills | Status: DC

## 2016-10-20 NOTE — Telephone Encounter (Signed)
Got a fax from [facility] requesting a 90 day supply of this patient's Amlodipine 5mg .  Refill request was sent to Dr. Alba CoryKrichna Sowles for approval and submission.

## 2016-10-26 DIAGNOSIS — R519 Headache, unspecified: Secondary | ICD-10-CM | POA: Insufficient documentation

## 2016-10-26 DIAGNOSIS — R51 Headache: Secondary | ICD-10-CM

## 2016-10-26 DIAGNOSIS — R55 Syncope and collapse: Secondary | ICD-10-CM | POA: Insufficient documentation

## 2016-11-25 ENCOUNTER — Telehealth: Payer: Self-pay

## 2016-11-25 ENCOUNTER — Encounter: Payer: Self-pay | Admitting: Family Medicine

## 2016-11-25 ENCOUNTER — Ambulatory Visit (INDEPENDENT_AMBULATORY_CARE_PROVIDER_SITE_OTHER): Payer: Managed Care, Other (non HMO) | Admitting: Family Medicine

## 2016-11-25 VITALS — BP 148/78 | HR 78 | Temp 98.4°F | Resp 16 | Ht 63.0 in | Wt 197.2 lb

## 2016-11-25 DIAGNOSIS — M5126 Other intervertebral disc displacement, lumbar region: Secondary | ICD-10-CM | POA: Diagnosis not present

## 2016-11-25 DIAGNOSIS — M5136 Other intervertebral disc degeneration, lumbar region: Secondary | ICD-10-CM

## 2016-11-25 DIAGNOSIS — R51 Headache: Secondary | ICD-10-CM

## 2016-11-25 DIAGNOSIS — R600 Localized edema: Secondary | ICD-10-CM

## 2016-11-25 DIAGNOSIS — Z01419 Encounter for gynecological examination (general) (routine) without abnormal findings: Secondary | ICD-10-CM | POA: Diagnosis not present

## 2016-11-25 DIAGNOSIS — I10 Essential (primary) hypertension: Secondary | ICD-10-CM | POA: Diagnosis not present

## 2016-11-25 DIAGNOSIS — R42 Dizziness and giddiness: Secondary | ICD-10-CM

## 2016-11-25 DIAGNOSIS — Z23 Encounter for immunization: Secondary | ICD-10-CM

## 2016-11-25 DIAGNOSIS — R519 Headache, unspecified: Secondary | ICD-10-CM

## 2016-11-25 MED ORDER — AMLODIPINE BESYLATE 2.5 MG PO TABS: ORAL_TABLET | ORAL | 0 refills | Status: DC

## 2016-11-25 MED ORDER — HYDROCHLOROTHIAZIDE 12.5 MG PO TABS: 12.5000 mg | ORAL_TABLET | Freq: Every day | ORAL | 0 refills | Status: DC

## 2016-11-25 MED ORDER — AMLODIPINE BESYLATE 5 MG PO TABS: ORAL_TABLET | ORAL | 0 refills | Status: DC

## 2016-11-25 MED ORDER — HYDROCHLOROTHIAZIDE 25 MG PO TABS: 25.0000 mg | ORAL_TABLET | Freq: Every day | ORAL | 0 refills | Status: DC

## 2016-11-25 MED ORDER — ATORVASTATIN CALCIUM 40 MG PO TABS: 40.0000 mg | ORAL_TABLET | Freq: Every day | ORAL | 0 refills | Status: DC

## 2016-11-25 NOTE — Progress Notes (Signed)
Name: Sherri Romero   MRN: [id number]    DOB: 10/12/1966   Date:11/25/2016       Progress Note  Subjective  Chief Complaint  Chief Complaint  Patient presents with  . Annual Exam    HPI  Well woman: sexually active, no vaginal discharge , s/p hysterectomy. No breast lumps, no pain during intercourse. She will schedule her mammogram.   Recurrent syncopal episodes: looking back on her chart, CT done in 2009 for same symptoms. It has been a recurrent problem, [facility], bp was very hight, CT head negative. She has seen Dr. Malvin JohnsPotter and is now on Nortriptyline dizziness, syncope and headaches have resolved. She started medication Nov 17  HTN uncontrolled: last catecholamines showed mild elevation of epinephrine, recheck level was normal,  normal TSH and CBC a few months ago. BP has improved significantly, but still not at goal, she likes the HCTZ because has helped with leg swelling, so we will continue 2.5 mg of Norvasc and go up from 12.5 to 25 mg of HCTZ  Low back pain with radiculitis: seeing Dr. Council Mechanichasniss and had she 3rd epidural yesterday. She still has pain, but expected for the day after procedure.    Lower extremity edema: doing better with compression stocking hoses and diuretics  Patient Active Problem List   Diagnosis Date Noted  . Syncope 10/26/2016  . Headache disorder 10/26/2016  . Mitral valve prolapse 09/15/2016  . Colon cancer screening 09/15/2016  . Lower extremity edema 09/15/2016  . Essential hypertension 06/12/2016  . Bulge of lumbar disc without myelopathy 01/02/2015  . Neuritis or radiculitis due to rupture of lumbar intervertebral disc 01/02/2015    Past Surgical History:  Procedure Laterality Date  . CARPAL TUNNEL RELEASE    . HYSTERECTOMY ABDOMINAL WITH SALPINGECTOMY    . INTERCOSTAL NERVE BLOCK    . TONSILLECTOMY AND ADENOIDECTOMY      Family History  Problem Relation Age of Onset  . Breast cancer Sister [id number]  . Heart disease  Mother   . Kidney disease Mother 2054  . Heart disease Father   . Stroke Father 3655    Social History   Social History  . Marital status: Married    Spouse name: N/A  . Number of children: N/A  . Years of education: N/A   Occupational History  . Not on file.   Social History Main Topics  . Smoking status: Never Smoker  . Smokeless tobacco: Never Used  . Alcohol use No  . Drug use: No  . Sexual activity: Not Currently   Other Topics Concern  . Not on file   Social History Narrative  . No narrative on file     Current Outpatient Prescriptions:  .  nortriptyline (PAMELOR) 10 MG capsule, Take 1-2 capsules by mouth at bedtime., Disp: , Rfl:  .  amLODipine (NORVASC) 2.5 MG tablet, One daily, Disp: 90 tablet, Rfl: 0 .  atorvastatin (LIPITOR) 40 MG tablet, Take 1 tablet (40 mg total) by mouth daily., Disp: 90 tablet, Rfl: 0 .  hydrochlorothiazide (HYDRODIURIL) 25 MG tablet, Take 1 tablet (25 mg total) by mouth daily. Please make sure she gets 25 mg and not 12.5 mg sent earlier today, Disp: 90 tablet, Rfl: 0 .  HYDROcodone-acetaminophen (NORCO) 10-325 MG tablet, Take 1 tablet by mouth daily as needed., Disp: , Rfl:   Allergies  Allergen Reactions  . Penicillins Anaphylaxis and Shortness Of Breath  . Cephalosporins  Other reaction(s): Unknown     ROS  Constitutional: Negative for fever or weight change.  Respiratory: Negative for cough and shortness of breath.   Cardiovascular: Negative for chest pain or palpitations.  Gastrointestinal: Negative for abdominal pain, no bowel changes.  Musculoskeletal: Negative for gait problem or joint swelling.  Skin: Negative for rash.  Neurological: Negative for dizziness or headache - resolved with medication and bp control .  No other specific complaints in a complete review of systems (except as listed in HPI above).  Objective  Vitals:   11/25/16 1101  BP: (!) 148/78  Pulse: 78  Resp: 16  Temp: 98.4 F (36.9 C)  TempSrc:  Oral  SpO2: 99%  Weight: 197 lb 4 oz (89.5 kg)  Height: 5\' 3"  (1.6 m)    Body mass index is 34.94 kg/m.  Physical Exam  Constitutional: Patient appears well-developed and obese  No distress.  HENT: Head: Normocephalic and atraumatic. Ears: B TMs ok, no erythema or effusion; Nose: Nose normal. Mouth/Throat: Oropharynx is clear and moist. No oropharyngeal exudate.  Eyes: Conjunctivae and EOM are normal. Pupils are equal, round, and reactive to light. No scleral icterus.  Neck: Normal range of motion. Neck supple. No JVD present. No thyromegaly present.  Cardiovascular: Normal rate, regular rhythm and normal heart sounds.  No murmur heard. positive for 2 plus pitting BLE edema. Pulmonary/Chest: Effort normal and breath sounds normal. No respiratory distress. Abdominal: Soft. Bowel sounds are normal, no distension. There is no tenderness. no masses Breast: no lumps or masses, no nipple discharge or rashes FEMALE GENITALIA:  External genitalia normal External urethra normal Vaginal vault normal without discharge or lesions Absent cervix No masses with bimanual exam RECTAL: not done Musculoskeletal: Normal range of motion, no joint effusions. No gross deformities Neurological: he is alert and oriented to person, place, and time. No cranial nerve deficit. Coordination, balance, strength, speech and gait are normal.  Skin: Skin is warm and dry. No rash noted. No erythema.  Psychiatric: Patient has a normal mood and affect. behavior is normal. Judgment and thought content normal.  Recent Results (from the past 2160 hour(s))  Lipid panel     Status: Abnormal   Collection Time: 09/15/16  8:54 AM  Result Value Ref Range   Cholesterol 207 (H) 125 - 200 mg/dL   Triglycerides 51 <478<150 mg/dL   HDL 79 >=29>=46 mg/dL   Total CHOL/HDL Ratio 2.6 <=5.0 Ratio   VLDL 10 <30 mg/dL   LDL Cholesterol 562118 <130<130 mg/dL    Comment:   Total Cholesterol/HDL Ratio:CHD Risk                        Coronary Heart  Disease Risk Table                                        Men       Women          1/2 Average Risk              3.4        3.3              Average Risk              5.0        4.4           2X Average Risk  9.6        7.1           3X Average Risk             23.4       11.0 Use the calculated Patient Ratio above and the CHD Risk table  to determine the patient's CHD Risk.   COMPLETE METABOLIC PANEL WITH GFR     Status: None   Collection Time: 09/15/16  8:54 AM  Result Value Ref Range   Sodium 138 135 - 146 mmol/L   Potassium 4.3 3.5 - 5.3 mmol/L   Chloride 103 98 - 110 mmol/L   CO2 28 20 - 31 mmol/L   Glucose, Bld 96 65 - 99 mg/dL   BUN 8 7 - 25 mg/dL   Creat 4.69 6.29 - 5.28 mg/dL    Comment:   For patients > or = 51 years of age: The upper reference limit for Creatinine is approximately 13% higher for people identified as African-American.      Total Bilirubin 0.6 0.2 - 1.2 mg/dL   Alkaline Phosphatase 57 33 - 130 U/L   AST 13 10 - 35 U/L   ALT 12 6 - 29 U/L   Total Protein 6.6 6.1 - 8.1 g/dL   Albumin 3.8 3.6 - 5.1 g/dL   Calcium 9.0 8.6 - 41.3 mg/dL   GFR, Est African American 86 >=60 mL/min   GFR, Est Non African American 75 >=60 mL/min  Hemoglobin A1c     Status: Abnormal   Collection Time: 09/15/16  8:54 AM  Result Value Ref Range   Hgb A1c MFr Bld 5.8 (H) <5.7 %    Comment:   For someone without known diabetes, a hemoglobin A1c value between 5.7% and 6.4% is consistent with prediabetes and should be confirmed with a follow-up test.   For someone with known diabetes, a value <7% indicates that their diabetes is well controlled. A1c targets should be individualized based on duration of diabetes, age, co-morbid conditions and other considerations.   This assay result is consistent with an increased risk of diabetes.   Currently, no consensus exists regarding use of hemoglobin A1c for diagnosis of diabetes in children.      Mean Plasma Glucose  120 mg/dL  Catecholamines, fractionated, plasma     Status: None   Collection Time: 09/15/16  8:54 AM  Result Value Ref Range   Epinephrine 39 pg/mL    Comment: This test was developed and its analytical performance characteristics have been determined by [facility] [place], [place]. It has not been cleared or approved by the U.S. Food and Drug Administration. This assay has been validated pursuant to the CLIA regulations and is used for clinical purposes.    Norepinephrine 501 pg/mL    Comment: This test was developed and its analytical performance characteristics have been determined by [facility] [place], [place]. It has not been cleared or approved by the U.S. Food and Drug Administration. This assay has been validated pursuant to the CLIA regulations and is used for clinical purposes.    Dopamine REPORT     Comment: Results are below reportable range for this analyte, which is 30 pg/mL. This test was developed and its analytical performance characteristics have been determined by [facility] [place], [place]. It has not been cleared or approved by the U.S. Food and Drug Administration. This assay has been validated pursuant to the CLIA regulations and is used for  clinical purposes.    Catecholamines, Total 540 pg/mL    Comment: Adult Reference Ranges for Catecholamines, Plasma Epinephrine         Supine:  LESS THAN 50 pg/mL                     Upright: LESS THAN 95 pg/mL Norepinephrine      Supine:  112-658 pg/mL                     Upright: [phone] pg/mL Dopamine            Supine:  LESS THAN 30 pg/mL                     Upright: LESS THAN 30 pg/mL Total (N+E)         Supine:  123-671 pg/mL                     Upright: [phone] pg/mL Pediatric Reference Ranges for Catecholamines, Plasma Due to stress, plasma catecholamine levels are generally unreliable in infants and small children. Urinary  catecholamine assays are more reliable. Epinephrine         Supine:  LESS THAN OR EQUAL    3-15 Years                TO 464 pg/mL                     Upright: Not Available Norepinephrine      Supine:  LESS THAN OR EQUAL    3-15 Years                TO 1251 pg/mL                     Upright: Not Available Dopamine            Supine:  LESS THAN 60 pg/mL    3-15 Years       Upright: Not Available Pedi atric data from [employer] [phone]. This test was developed and its analytical performance characteristics have been determined by [facility] [place], [place]. It has not been cleared or approved by the U.S. Food and Drug Administration. This assay has been validated pursuant to the CLIA regulations and is used for clinical purposes.       PHQ2/9: Depression screen [facility] 2/9 11/25/2016 09/15/2016 06/12/2016  Decreased Interest 0 0 0  Down, Depressed, Hopeless 0 0 0  PHQ - 2 Score 0 0 0     Fall Risk: Fall Risk  11/25/2016 09/15/2016 06/12/2016  Falls in the past year? No No No    Functional Status Survey: Is the patient deaf or have difficulty hearing?: No Does the patient have difficulty seeing, even when wearing glasses/contacts?: No Does the patient have difficulty concentrating, remembering, or making decisions?: No Does the patient have difficulty walking or climbing stairs?: Yes Does the patient have difficulty dressing or bathing?: No Does the patient have difficulty doing errands alone such as visiting a doctor's office or shopping?: No    Assessment & Plan  1. Well woman exam  Discussed importance of 150 minutes of physical activity weekly, eat two servings of fish weekly, eat one serving of tree nuts ( cashews, pistachios, pecans, almonds.Marland Kitchen) every other day, eat 6 servings of fruit/vegetables daily and drink plenty of water and avoid sweet beverages.   2. Need for diphtheria-tetanus-pertussis (Tdap) vaccine  -  Tdap vaccine  greater than or equal to 7yo IM  3. Needs flu shot  refused   4. Uncontrolled hypertension  We will increase dose of diuretic since she does not like leg swelling and bp is still slightly elevated - amLODipine (NORVASC) 2.5 MG tablet; One daily  Dispense: 90 tablet; Refill: 0 - hydrochlorothiazide (HYDRODIURIL) 25 MG tablet; Take 1 tablet (25 mg total) by mouth daily. Please make sure she gets 25 mg and not 12.5 mg sent earlier today  Dispense: 90 tablet; Refill: 0  5. Bulge of lumbar disc without myelopathy  Continue follow up with Dr. Yves Dill, doing well with epidural injection   6. Dizziness  Resolved, bp has improved with Nortriptyline , no other episodes of syncope, follow up with Dr. Malvin Johns as instructed  7. Lower extremity edema  Improved with HCTZ and also compression stocking hoses  8. Headache disorder  Resolved, and was advised to try to go down from 20 mg to 10 mg of Nortriptyline by Dr. Malvin Johns

## 2016-11-25 NOTE — Telephone Encounter (Signed)
Patient wants to be scheduled for a colonoscopy

## 2016-11-26 ENCOUNTER — Telehealth: Payer: Self-pay | Admitting: Gastroenterology

## 2016-11-26 NOTE — Telephone Encounter (Signed)
colonoscopy

## 2016-11-30 ENCOUNTER — Other Ambulatory Visit: Payer: Self-pay

## 2016-11-30 ENCOUNTER — Telehealth: Payer: Self-pay

## 2016-11-30 NOTE — Telephone Encounter (Signed)
Gastroenterology Pre-Procedure Review  Request Date:  Requesting Physician: Dr.   PATIENT REVIEW QUESTIONS: The patient responded to the following health history questions as indicated:    1. Are you having any GI issues? no 2. Do you have a personal history of Polyps? no 3. Do you have a family history of Colon Cancer or Polyps? no 4. Diabetes Mellitus? no 5. Joint replacements in the past 12 months?no 6. Major health problems in the past 3 months?no 7. Any artificial heart valves, MVP, or defibrillator?no    MEDICATIONS & ALLERGIES:    Patient reports the following regarding taking any anticoagulation/antiplatelet therapy:   Plavix, Coumadin, Eliquis, Xarelto, Lovenox, Pradaxa, Brilinta, or Effient? no Aspirin? no  Patient confirms/reports the following medications:  Current Outpatient Prescriptions  Medication Sig Dispense Refill  . amLODipine (NORVASC) 2.5 MG tablet One daily 90 tablet 0  . atorvastatin (LIPITOR) 40 MG tablet Take 1 tablet (40 mg total) by mouth daily. 90 tablet 0  . hydrochlorothiazide (HYDRODIURIL) 25 MG tablet Take 1 tablet (25 mg total) by mouth daily. Please make sure she gets 25 mg and not 12.5 mg sent earlier today 90 tablet 0  . HYDROcodone-acetaminophen (NORCO) 10-325 MG tablet Take 1 tablet by mouth daily as needed.    . nortriptyline (PAMELOR) 10 MG capsule Take 1-2 capsules by mouth at bedtime.     No current facility-administered medications for this visit.     Patient confirms/reports the following allergies:  Allergies  Allergen Reactions  . Penicillins Anaphylaxis and Shortness Of Breath  . Cephalosporins     Other reaction(s): Unknown    No orders of the defined types were placed in this encounter.   AUTHORIZATION INFORMATION Primary Insurance: 1D#: Group #:  Secondary Insurance: 1D#: Group #:  SCHEDULE INFORMATION: Date: 12/10/16 Time: Location: [facility]

## 2016-11-30 NOTE — Telephone Encounter (Signed)
Pt scheduled [facility] on 12/10/16 with Tobi BastosAnna. Please precert

## 2016-12-01 NOTE — Telephone Encounter (Signed)
Patient has [employer] Pre cert is not required  

## 2016-12-09 ENCOUNTER — Encounter: Payer: Self-pay | Admitting: *Deleted

## 2016-12-10 ENCOUNTER — Ambulatory Visit: Payer: Managed Care, Other (non HMO) | Admitting: Certified Registered Nurse Anesthetist

## 2016-12-10 ENCOUNTER — Encounter: Payer: Self-pay | Admitting: *Deleted

## 2016-12-10 ENCOUNTER — Ambulatory Visit
Admission: RE | Admit: 2016-12-10 | Discharge: 2016-12-10 | Disposition: A | Discharge status: Home / Self Care | Payer: Managed Care, Other (non HMO) | Source: Ambulatory Visit | Attending: Gastroenterology | Admitting: Gastroenterology

## 2016-12-10 ENCOUNTER — Encounter
Admission: RE | Disposition: A | Discharge status: Home / Self Care | Payer: Self-pay | Source: Ambulatory Visit | Attending: Gastroenterology

## 2016-12-10 DIAGNOSIS — E785 Hyperlipidemia, unspecified: Secondary | ICD-10-CM | POA: Diagnosis not present

## 2016-12-10 DIAGNOSIS — Z1211 Encounter for screening for malignant neoplasm of colon: Secondary | ICD-10-CM | POA: Insufficient documentation

## 2016-12-10 DIAGNOSIS — I10 Essential (primary) hypertension: Secondary | ICD-10-CM | POA: Insufficient documentation

## 2016-12-10 DIAGNOSIS — K640 First degree hemorrhoids: Secondary | ICD-10-CM | POA: Insufficient documentation

## 2016-12-10 DIAGNOSIS — Z79899 Other long term (current) drug therapy: Secondary | ICD-10-CM | POA: Insufficient documentation

## 2016-12-10 HISTORY — PX: COLONOSCOPY WITH PROPOFOL: SHX5780

## 2016-12-10 HISTORY — DX: Hyperlipidemia, unspecified: E78.5

## 2016-12-10 SURGERY — COLONOSCOPY WITH PROPOFOL
Anesthesia: General

## 2016-12-10 MED ORDER — FENTANYL CITRATE (PF) 100 MCG/2ML IJ SOLN: 25.0000 ug | INTRAMUSCULAR | Status: DC | PRN

## 2016-12-10 MED ORDER — PROPOFOL 500 MG/50ML IV EMUL
INTRAVENOUS | Status: AC
  Filled 2016-12-10: qty 50

## 2016-12-10 MED ORDER — LIDOCAINE 2% (20 MG/ML) 5 ML SYRINGE
INTRAMUSCULAR | Status: AC
  Filled 2016-12-10: qty 5

## 2016-12-10 MED ORDER — SODIUM CHLORIDE 0.9 % IV SOLN
INTRAVENOUS | Status: DC
  Administered 2016-12-10: 10:00:00 via INTRAVENOUS

## 2016-12-10 MED ORDER — ONDANSETRON HCL 4 MG/2ML IJ SOLN: 4.0000 mg | Freq: Once | INTRAMUSCULAR | Status: DC | PRN

## 2016-12-10 MED ORDER — PROPOFOL 10 MG/ML IV BOLUS
INTRAVENOUS | Status: DC | PRN
  Administered 2016-12-10: 70 mg via INTRAVENOUS

## 2016-12-10 MED ORDER — PROPOFOL 500 MG/50ML IV EMUL
INTRAVENOUS | Status: DC | PRN
  Administered 2016-12-10: 140 ug/kg/min via INTRAVENOUS

## 2016-12-10 NOTE — Anesthesia Preprocedure Evaluation (Addendum)
Anesthesia Evaluation  Patient identified by MRN, date of birth, ID band Patient awake    Reviewed: Allergy & Precautions, NPO status , Patient's Chart, lab work & pertinent test results  Airway Mallampati: III  TM Distance: <3 FB     Dental no notable dental hx. (+) Caps   Pulmonary neg pulmonary ROS,    Pulmonary exam normal        Cardiovascular hypertension, Pt. on medications Normal cardiovascular exam+ Valvular Problems/Murmurs MVP      Neuro/Psych  Headaches,  Neuromuscular disease negative psych ROS   GI/Hepatic negative GI ROS, Neg liver ROS,   Endo/Other  negative endocrine ROS  Renal/GU negative Renal ROS  negative genitourinary   Musculoskeletal  (+) Arthritis , Osteoarthritis,    Abdominal Normal abdominal exam  (+)   Peds negative pediatric ROS (+)  Hematology negative hematology ROS (+)   Anesthesia Other Findings   Reproductive/Obstetrics                            Anesthesia Physical Anesthesia Plan  ASA: II  Anesthesia Plan: General   Post-op Pain Management:    Induction: Intravenous  Airway Management Planned: Nasal Cannula  Additional Equipment:   Intra-op Plan:   Post-operative Plan:   Informed Consent: I have reviewed the patients History and Physical, chart, labs and discussed the procedure including the risks, benefits and alternatives for the proposed anesthesia with the patient or authorized representative who has indicated his/her understanding and acceptance.   Dental advisory given  Plan Discussed with: CRNA and Surgeon  Anesthesia Plan Comments:         Anesthesia Quick Evaluation

## 2016-12-10 NOTE — Anesthesia Procedure Notes (Signed)
Performed by: Staci Carver Pre-anesthesia Checklist: Patient identified, Emergency Drugs available, Suction available, Patient being monitored and Timeout performed Patient Re-evaluated:Patient Re-evaluated prior to inductionOxygen Delivery Method: Nasal cannula Intubation Type: IV induction       

## 2016-12-10 NOTE — Transfer of Care (Signed)
Immediate Anesthesia Transfer of Care Note  Patient: Sherri Romero  Procedure(s) Performed: Procedure(s): COLONOSCOPY WITH PROPOFOL (N/A)  Patient Location: Endoscopy Unit  Anesthesia Type:General  Level of Consciousness: awake and alert   Airway & Oxygen Therapy: Patient Spontanous Breathing and Patient connected to nasal cannula oxygen  Post-op Assessment: Report given to RN and Post -op Vital signs reviewed and stable  Post vital signs: Reviewed  Last Vitals:  Vitals:   12/10/16 1100 12/10/16 1113  BP:  (!) 121/59  Pulse: 64 (!) 58  Resp: 16 18  Temp: 36.2 C     Last Pain:  Vitals:   12/10/16 1100  TempSrc: Tympanic         Complications: No apparent anesthesia complications

## 2016-12-10 NOTE — H&P (Signed)
  Sherri MoodKiran Daundre Biel MD [address] [place], [place] [address] Phone: [phone] Fax : [phone]  Primary Care Physician:  Sherri FavorsKrichna F Sowles, MD Primary Gastroenterologist:  Dr. Wyline MoodKiran Niveah Romero   Pre-Procedure History & Physical: HPI:  Sherri Romero is here for an colonoscopy.   Past Medical History:  Diagnosis Date  . Hyperlipidemia   . Hypertension     Past Surgical History:  Procedure Laterality Date  . CARPAL TUNNEL RELEASE    . HYSTERECTOMY ABDOMINAL WITH SALPINGECTOMY    . INTERCOSTAL NERVE BLOCK    . TONSILLECTOMY AND ADENOIDECTOMY      Prior to Admission medications   Medication Sig Start Date End Date Taking? Authorizing Provider  amLODipine (NORVASC) 2.5 MG tablet One daily 11/25/16   Sherri CoryKrichna Sowles, MD  atorvastatin (LIPITOR) 40 MG tablet Take 1 tablet (40 mg total) by mouth daily. 11/25/16   Sherri CoryKrichna Sowles, MD  hydrochlorothiazide (HYDRODIURIL) 25 MG tablet Take 1 tablet (25 mg total) by mouth daily. Please make sure she gets 25 mg and not 12.5 mg sent earlier today 11/25/16   Sherri CoryKrichna Sowles, MD  HYDROcodone-acetaminophen [facility](NORCO) 10-325 MG tablet Take 1 tablet by mouth daily as needed. 08/24/16   Sherri RayBenjamin Chasnis, MD  nortriptyline (PAMELOR) 10 MG capsule Take 1-2 capsules by mouth at bedtime. 11/24/16   Sherri CrockerZachary E Potter, MD    Allergies as of 11/30/2016 - Review Complete 11/25/2016  Allergen Reaction Noted  . Penicillins Anaphylaxis and Shortness Of Breath 10/08/2015  . Cephalosporins  01/31/2016    Family History  Problem Relation Age of Onset  . Breast cancer Sister 8741  . Heart disease Mother   . Kidney disease Mother 6354  . Heart disease Father   . Stroke Father 7055    Social History   Social History  . Marital status: Married    Spouse name: N/A  . Number of children: N/A  . Years of education: N/A   Occupational History  . Not on file.   Social History Main Topics  . Smoking status: Never Smoker  . Smokeless  tobacco: Never Used  . Alcohol use No  . Drug use: No  . Sexual activity: Not Currently   Other Topics Concern  . Not on file   Social History Narrative  . No narrative on file    Review of Systems: See HPI, otherwise negative ROS  Physical Exam: BP (!) 169/72   Pulse (!) 56   Temp 98.8 F (37.1 C) (Tympanic)   Resp 16   Ht 5\' 3"  (1.6 m)   Wt 197 lb (89.4 kg)   SpO2 100%   BMI 34.90 kg/m  General:   Alert,  pleasant and cooperative in NAD Head:  Normocephalic and atraumatic. Neck:  Supple; no masses or thyromegaly. Lungs:  Clear throughout to auscultation.    Heart:  Regular rate and rhythm. Abdomen:  Soft, nontender and nondistended. Normal bowel sounds, without guarding, and without rebound.   Neurologic:  Alert and  oriented x4;  grossly normal neurologically.  Impression/Plan: Sherri Romero is here for an colonoscopy to be performed for Screening colonoscopy average risk    Risks, benefits, limitations, and alternatives regarding  colonoscopy have been reviewed with the patient.  Questions have been answered.  All parties agreeable.   Sherri MoodKiran Shanvi Moyd, MD  12/10/2016, 10:38 AM

## 2016-12-10 NOTE — Op Note (Signed)
[facility] Gastroenterology Patient Name: Sherri BoutonSharon Romero Procedure Date: 12/10/2016 10:39 AM MRN: [id number] Account #: [id number] Date of Birth: 12/25/1965 Admit Type: Outpatient Age: [age] [facility]OM 4 Gender: Female Note Status: Finalized Procedure:            Colonoscopy Indications:          Screening for colorectal malignant neoplasm Providers:            Wyline MoodKiran Kiona Blume MD, MD Referring MD:         Onnie BoerKrichna F. Sowles, MD (Referring MD) Medicines:            Monitored Anesthesia Care Complications:        No immediate complications. Procedure:            Pre-Anesthesia Assessment:                       - Prior to the procedure, a History and Physical was                        performed, and patient medications, allergies and                        sensitivities were reviewed. The patient's tolerance of                        previous anesthesia was reviewed.                       - The risks and benefits of the procedure and the                        sedation options and risks were discussed with the                        patient. All questions were answered and informed                        consent was obtained.                       - The risks and benefits of the procedure and the                        sedation options and risks were discussed with the                        patient. All questions were answered and informed                        consent was obtained.                       - ASA Grade Assessment: II - A patient with mild                        systemic disease.                       After obtaining informed consent, the colonoscope was  passed under direct vision. Throughout the procedure,                        the patient's blood pressure, pulse, and oxygen                        saturations were monitored continuously. The                        Colonoscope was introduced through the anus and               advanced to the the cecum, identified by the                        appendiceal orifice, IC valve and transillumination.                        The colonoscopy was performed with ease. The patient                        tolerated the procedure well. The quality of the bowel                        preparation was excellent. Findings:      Non-bleeding internal hemorrhoids were found during retroflexion. The       hemorrhoids were small and Grade I (internal hemorrhoids that do not       prolapse).      The exam was otherwise without abnormality. Impression:           - No specimens collected. Recommendation:       - Patient has a contact number available for                        emergencies. The signs and symptoms of potential                        delayed complications were discussed with the patient.                        Return to normal activities tomorrow. Written discharge                        instructions were provided to the patient.                       - Resume previous diet.                       - Continue present medications.                       - Repeat colonoscopy in 10 years for screening purposes. Procedure Code(s):    --- Professional ---                       [id number], Colorectal cancer screening; colonoscopy on                        individual not meeting criteria for high risk Diagnosis Code(s):    --- Professional ---  Z12.11, Encounter for screening for malignant neoplasm                        of colon CPT copyright 2016 American Medical Association. All rights reserved. The codes documented in this report are preliminary and upon coder review may  be revised to meet current compliance requirements. Wyline Mood, MD Wyline Mood MD, MD 12/10/2016 11:13:16 AM This report has been signed electronically. Number of Addenda: 0 Note Initiated On: 12/10/2016 10:39 AM Scope Withdrawal Time: 0 hours 13 minutes 50 seconds  Total  Procedure Duration: 0 hours 19 minutes 35 seconds       [facility]

## 2016-12-11 ENCOUNTER — Encounter: Payer: Self-pay | Admitting: Gastroenterology

## 2016-12-11 NOTE — Anesthesia Postprocedure Evaluation (Signed)
Anesthesia Post Note  Patient: Sherri Romero  Procedure(s) Performed: Procedure(s) (LRB): COLONOSCOPY WITH PROPOFOL (N/A)  Patient location during evaluation: PACU Anesthesia Type: General Level of consciousness: awake and alert and oriented Pain management: pain level controlled Vital Signs Assessment: post-procedure vital signs reviewed and stable Respiratory status: spontaneous breathing Cardiovascular status: blood pressure returned to baseline Anesthetic complications: no     Last Vitals:  Vitals:   12/10/16 1120 12/10/16 1130  BP: 138/74 (!) 145/79  Pulse: (!) 50 64  Resp: 19 (!) 21  Temp:      Last Pain:  Vitals:   12/10/16 1100  TempSrc: Tympanic                 Anne Sebring

## 2017-01-17 ENCOUNTER — Other Ambulatory Visit: Payer: Self-pay | Admitting: Family Medicine

## 2017-01-18 NOTE — Telephone Encounter (Signed)
Patient requesting refill of Amlodipine to [facility].  

## 2017-02-01 ENCOUNTER — Telehealth: Payer: Self-pay | Admitting: Family Medicine

## 2017-02-01 DIAGNOSIS — I10 Essential (primary) hypertension: Secondary | ICD-10-CM

## 2017-02-02 NOTE — Telephone Encounter (Signed)
Patient requesting refill of Amlodipine to [facility].  

## 2017-02-03 NOTE — Telephone Encounter (Signed)
Pt currently do not need the medication states that the pharmacy just sends it on automatic. She will give us a call

## 2017-02-04 ENCOUNTER — Other Ambulatory Visit: Payer: Self-pay | Admitting: Family Medicine

## 2017-02-04 DIAGNOSIS — I10 Essential (primary) hypertension: Secondary | ICD-10-CM

## 2017-02-20 ENCOUNTER — Other Ambulatory Visit: Payer: Self-pay | Admitting: Family Medicine

## 2017-02-20 DIAGNOSIS — I10 Essential (primary) hypertension: Secondary | ICD-10-CM

## 2017-06-09 IMAGING — CT CT HEAD W/O CM
1 series · 16 of 27 positions shown, 20 images · non-contrast
Comparison: 09/23/2011

CLINICAL DATA: Visual difficulties 2 days ago with persistent
headaches, initial encounter

EXAM:
CT HEAD WITHOUT CONTRAST
TECHNIQUE: Contiguous axial images were obtained from the base of the skull
through the vertex without intravenous contrast.

[Series 2: head wo · axial · 0.41mm/px · z∈[-177,-57]mm · 16 of 27 slices shown, 20 images]
[im 2/27  brain]
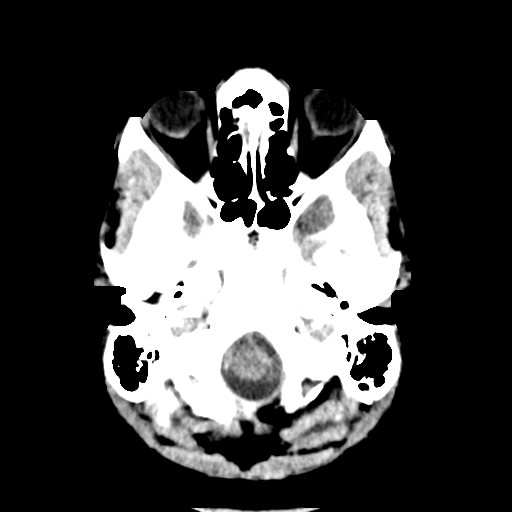
[im 2/27  bone]
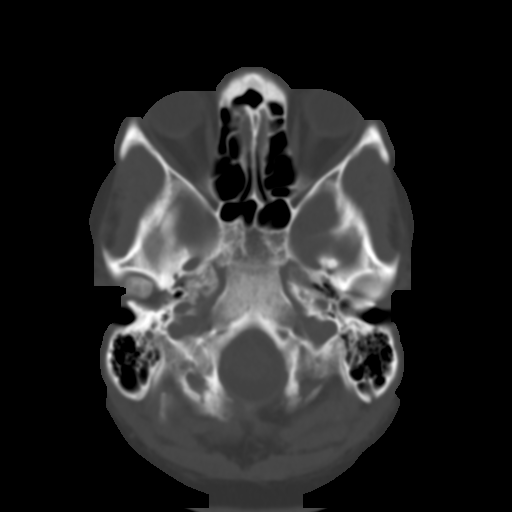
[im 4/27  brain]
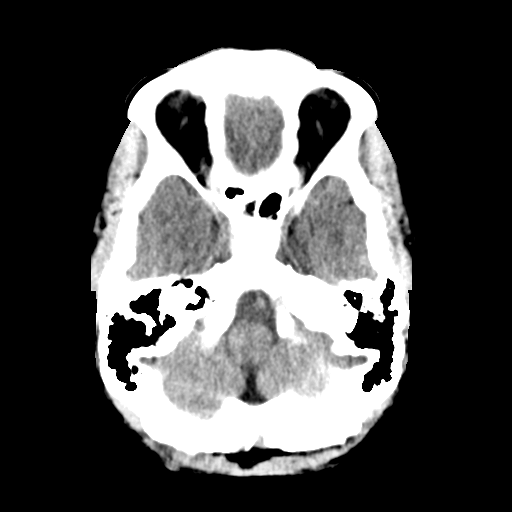
[im 5/27  brain]
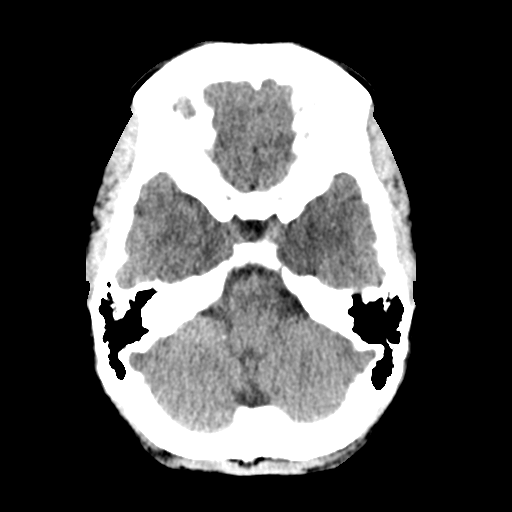
[im 7/27  brain]
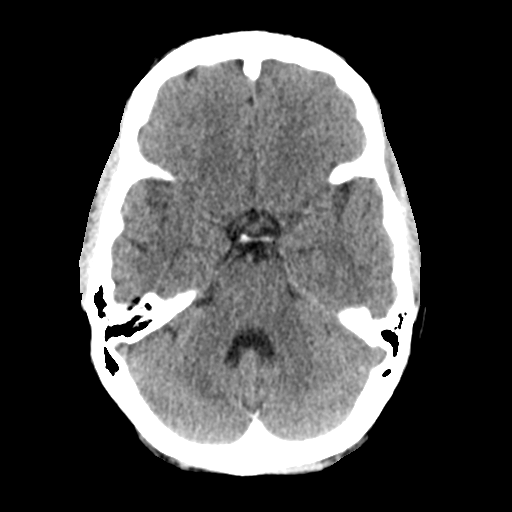
[im 9/27  brain]
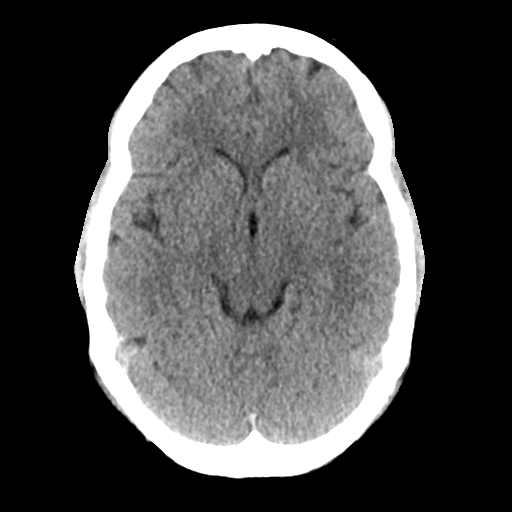
[im 9/27  bone]
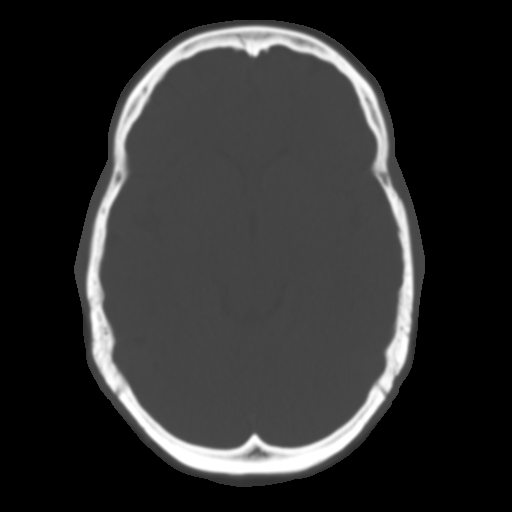
[im 10/27  brain]
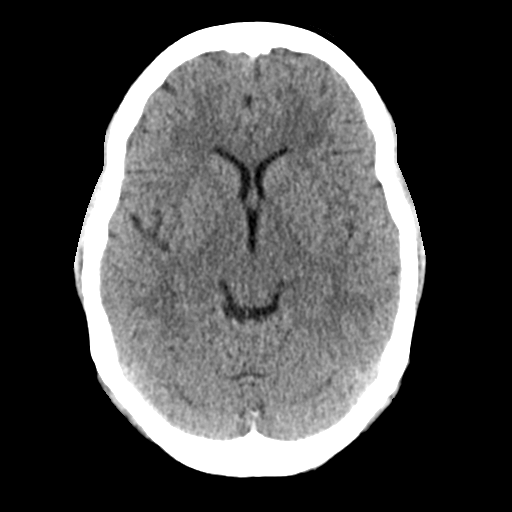
[im 12/27  brain]
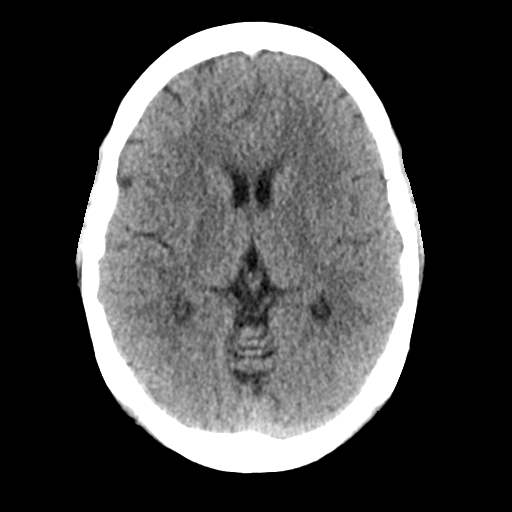
[im 13/27  brain]
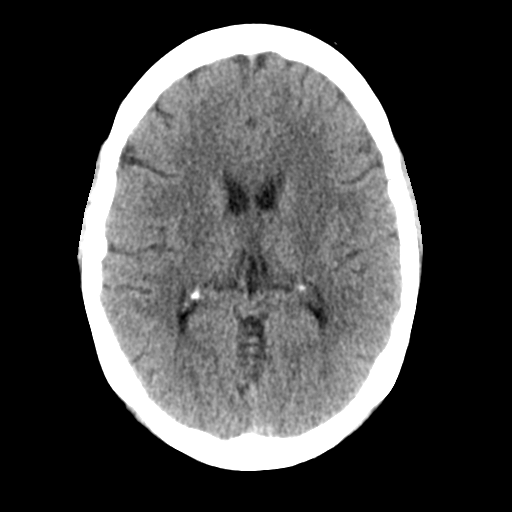
[im 15/27  brain]
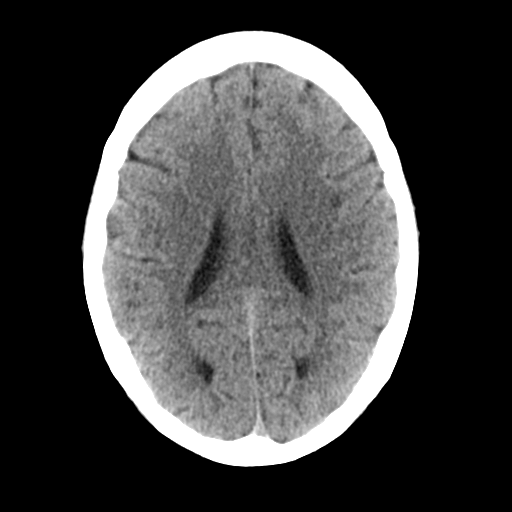
[im 15/27  bone]
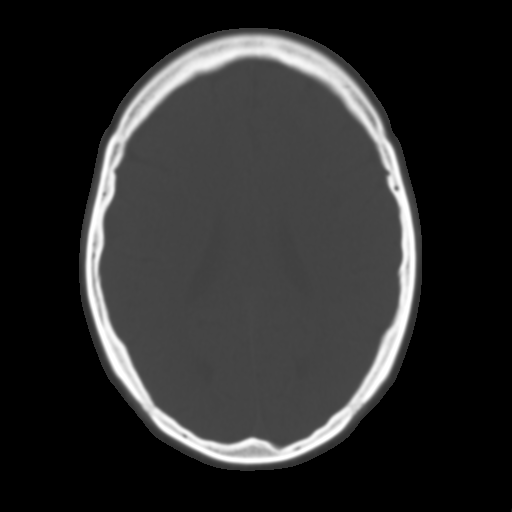
[im 16/27  brain]
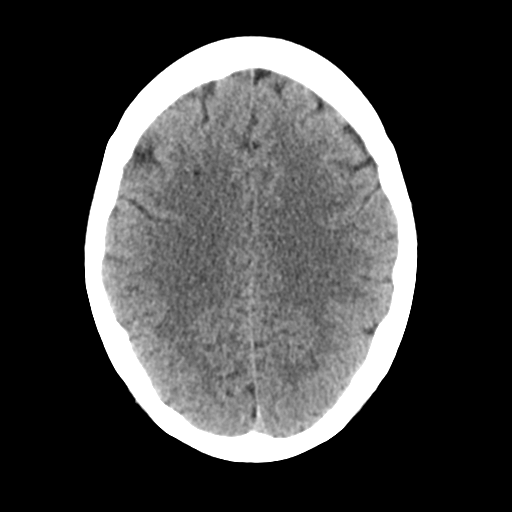
[im 18/27  brain]
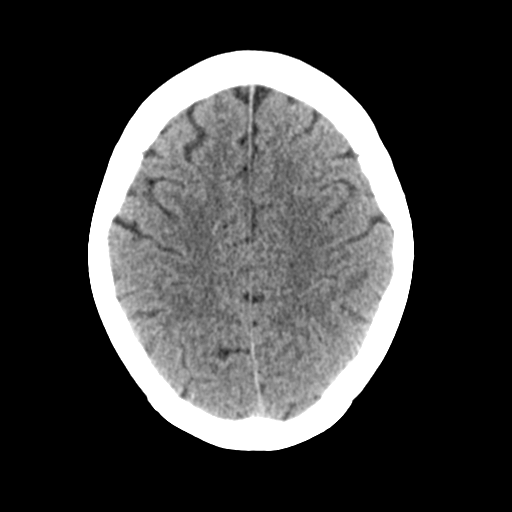
[im 19/27  brain]
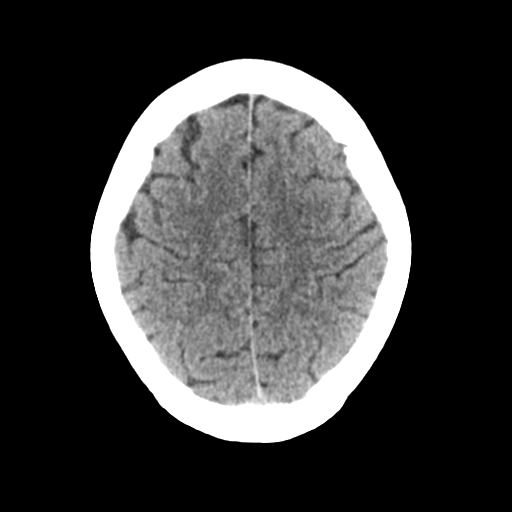
[im 21/27  brain]
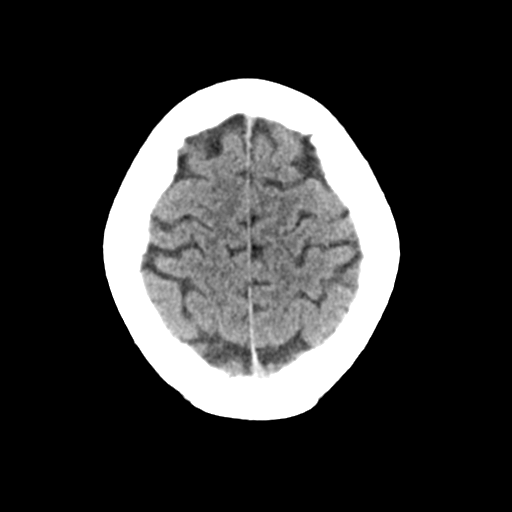
[im 21/27  bone]
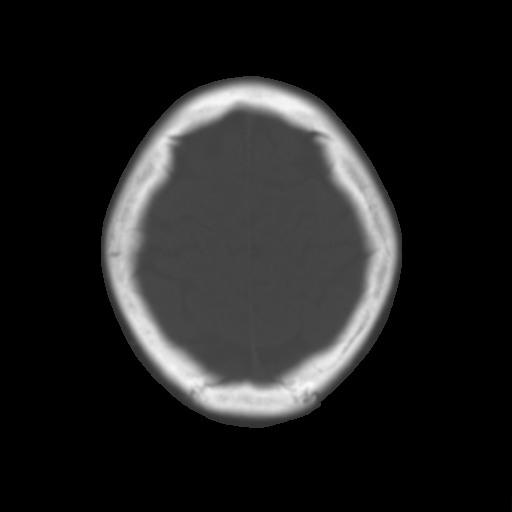
[im 23/27  brain]
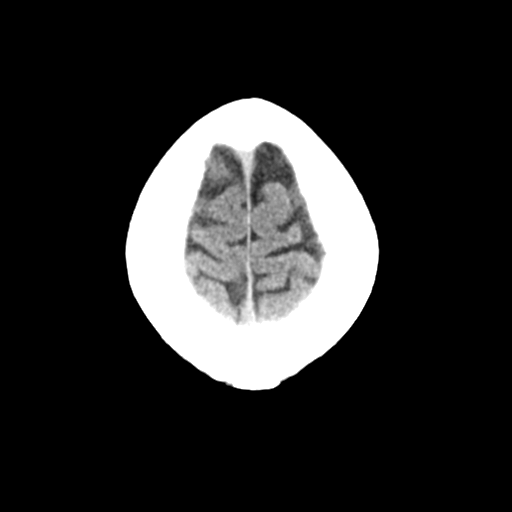
[im 24/27  brain]
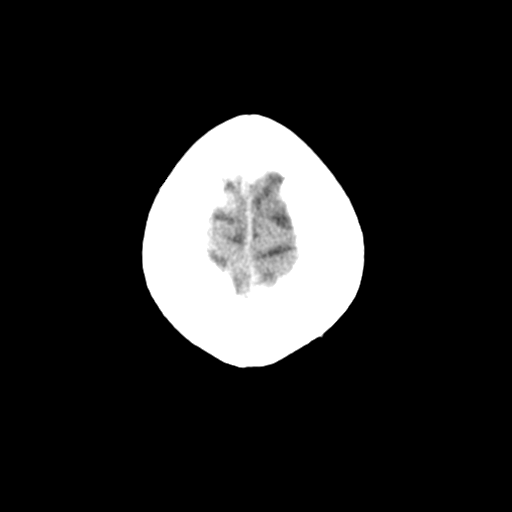
[im 26/27  brain]
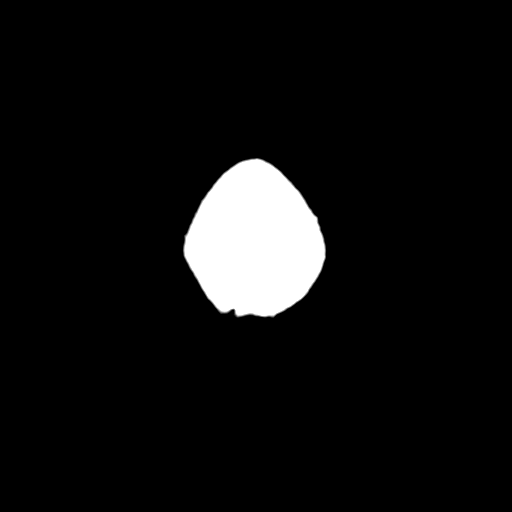

[16 of 27 positions shown; findings below may reference images not displayed]

FINDINGS: Bony calvarium is intact. No gross soft tissue abnormality is noted.
No findings to suggest acute hemorrhage, acute infarction or
space-occupying mass lesion are noted.
IMPRESSION: No acute intracranial abnormality noted.

## 2017-08-13 ENCOUNTER — Emergency Department
Admission: EM | Admit: 2017-08-13 | Discharge: 2017-08-13 | Disposition: A | Discharge status: Home / Self Care | Payer: Commercial Managed Care - PPO | Attending: Emergency Medicine | Admitting: Emergency Medicine

## 2017-08-13 ENCOUNTER — Encounter: Payer: Self-pay | Admitting: Emergency Medicine

## 2017-08-13 DIAGNOSIS — Z79899 Other long term (current) drug therapy: Secondary | ICD-10-CM | POA: Diagnosis not present

## 2017-08-13 DIAGNOSIS — R55 Syncope and collapse: Secondary | ICD-10-CM | POA: Insufficient documentation

## 2017-08-13 DIAGNOSIS — R51 Headache: Secondary | ICD-10-CM | POA: Diagnosis present

## 2017-08-13 DIAGNOSIS — I341 Nonrheumatic mitral (valve) prolapse: Secondary | ICD-10-CM | POA: Insufficient documentation

## 2017-08-13 DIAGNOSIS — I10 Essential (primary) hypertension: Secondary | ICD-10-CM | POA: Insufficient documentation

## 2017-08-13 DIAGNOSIS — R519 Headache, unspecified: Secondary | ICD-10-CM

## 2017-08-13 LAB — CBC
HCT: 38.9 % (ref 35.0–47.0)
HEMOGLOBIN: 13.2 g/dL (ref 12.0–16.0)
MCH: 32.3 pg (ref 26.0–34.0)
MCHC: 34.1 g/dL (ref 32.0–36.0)
MCV: 94.7 fL (ref 80.0–100.0)
PLATELETS: 274 10*3/uL (ref 150–440)
RBC: 4.11 MIL/uL (ref 3.80–5.20)
RDW: 14.3 % (ref 11.5–14.5)
WBC: 7.1 10*3/uL (ref 3.6–11.0)

## 2017-08-13 LAB — COMPREHENSIVE METABOLIC PANEL
ALBUMIN: 3.6 g/dL (ref 3.5–5.0)
ALT: 10 U/L — ABNORMAL LOW (ref 14–54)
ANION GAP: 6 (ref 5–15)
AST: 14 U/L — ABNORMAL LOW (ref 15–41)
Alkaline Phosphatase: 57 U/L (ref 38–126)
BUN: 8 mg/dL (ref 6–20)
CALCIUM: 8.7 mg/dL — AB (ref 8.9–10.3)
CHLORIDE: 105 mmol/L (ref 101–111)
CO2: 28 mmol/L (ref 22–32)
CREATININE: 0.97 mg/dL (ref 0.44–1.00)
GFR calc Af Amer: >60 mL/min (ref >60)
GFR calc non Af Amer: >60 mL/min (ref >60)
GLUCOSE: 95 mg/dL (ref 65–99)
POTASSIUM: 3.8 mmol/L (ref 3.5–5.1)
SODIUM: 139 mmol/L (ref 135–145)
TOTAL PROTEIN: 7 g/dL (ref 6.5–8.1)
Total Bilirubin: 0.7 mg/dL (ref 0.3–1.2)

## 2017-08-13 LAB — TROPONIN I: Troponin I: <0.03 ng/mL (ref <0.03)

## 2017-08-13 MED ORDER — KETOROLAC TROMETHAMINE 30 MG/ML IJ SOLN
30.0000 mg | Freq: Once | INTRAMUSCULAR | Status: AC
  Administered 2017-08-13: 30 mg via INTRAVENOUS
  Filled 2017-08-13: qty 1

## 2017-08-13 MED ORDER — SODIUM CHLORIDE 0.9 % IV SOLN
1000.0000 mL | Freq: Once | INTRAVENOUS | Status: AC
  Administered 2017-08-13: 1000 mL via INTRAVENOUS

## 2017-08-13 NOTE — ED Notes (Signed)
AAOx3.  Skin warm and dry.  NAD 

## 2017-08-13 NOTE — ED Triage Notes (Signed)
Arrives via [facility] with c/o headache and dizziness.  Patient reports a two day hisotry of recurrent headache and dizziness symptoms.  STates blood pressure has been elevated recently and patient had stopped taking BP meds for 2-3 weeks and restarted them on Wednesday.  States has seen a Neurologist for headaches in the past.   AAOx3.  Skin warm and dry.  MAE equally and strong. NAD

## 2017-08-13 NOTE — ED Provider Notes (Signed)
[facility] Emergency Department Provider Note   ____________________________________________    I have reviewed the triage vital signs and the nursing notes.   HISTORY  Chief Complaint Headache and Near Syncope     HPI Sherri Romero is a 51 y.o. female who presents with complaints of headache and high blood pressure. Patient reports she stopped taking her blood pressure medication and headache medication 1 week ago. All of days ago she developed a global throbbing headache typical of her headaches for which she was prescribed nortriptyline by her neurologist but which she has not been taking. Denies neuro deficits. No fevers or chills. She also notes her blood pressure has been elevated as well, she thinks this has to do with her headache as well. No nausea or vomiting. No change in vision.   Past Medical History:  Diagnosis Date  . Hyperlipidemia   . Hypertension     Patient Active Problem List   Diagnosis Date Noted  . Syncope 10/26/2016  . Headache disorder 10/26/2016  . Mitral valve prolapse 09/15/2016  . Colon cancer screening 09/15/2016  . Lower extremity edema 09/15/2016  . Essential hypertension 06/12/2016  . Bulge of lumbar disc without myelopathy 01/02/2015  . Neuritis or radiculitis due to rupture of lumbar intervertebral disc 01/02/2015    Past Surgical History:  Procedure Laterality Date  . CARPAL TUNNEL RELEASE    . COLONOSCOPY WITH PROPOFOL N/A 12/10/2016   Procedure: COLONOSCOPY WITH PROPOFOL;  Surgeon: Wyline MoodKiran Anna, MD;  [facility];  Service: Endoscopy;  Laterality: N/A;  . HYSTERECTOMY ABDOMINAL WITH SALPINGECTOMY    . INTERCOSTAL NERVE BLOCK    . TONSILLECTOMY AND ADENOIDECTOMY      Prior to Admission medications   Medication Sig Start Date End Date Taking? Authorizing Provider  amLODipine (NORVASC) 2.5 MG tablet One daily 11/25/16   Alba CorySowles, Krichna, MD  atorvastatin (LIPITOR) 40 MG tablet Take 1  tablet (40 mg total) by mouth daily. 11/25/16   Alba CorySowles, Krichna, MD  hydrochlorothiazide (HYDRODIURIL) 25 MG tablet Take 1 tablet (25 mg total) by mouth daily. Please make sure she gets 25 mg and not 12.5 mg sent earlier today 11/25/16   Alba CorySowles, Krichna, MD  HYDROcodone-acetaminophen [facility](NORCO) 10-325 MG tablet Take 1 tablet by mouth daily as needed. 08/24/16   Merri Rayhasnis, Benjamin, MD  nortriptyline (PAMELOR) 10 MG capsule Take 1-2 capsules by mouth at bedtime. 11/24/16   Morene CrockerPotter, Zachary E, MD     Allergies Penicillins and Cephalosporins  Family History  Problem Relation Age of Onset  . Breast cancer Sister 5141  . Heart disease Mother   . Kidney disease Mother 2254  . Heart disease Father   . Stroke Father 1955    Social History Social History  Substance Use Topics  . Smoking status: Never Smoker  . Smokeless tobacco: Never Used  . Alcohol use No    Review of Systems  Constitutional: No fever/chills Eyes: No visual changes.  ENT: No sore throat. Cardiovascular: Denies chest pain. Respiratory: Denies shortness of breath. Gastrointestinal: No abdominal pain.  No nausea, no vomiting.   Genitourinary: Negative for dysuria. Musculoskeletal: Negative for back pain. Skin: Negative for rash. Neurological: as above   ____________________________________________   PHYSICAL EXAM:  VITAL SIGNS: ED Triage Vitals  Enc Vitals Group     BP 08/13/17 1149 (!) 171/92     Pulse Rate 08/13/17 1149 78     Resp 08/13/17 1149 16     Temp 08/13/17 1149 98.1  F (36.7 C)     Temp Source 08/13/17 1149 Oral     SpO2 08/13/17 1149 (!) 71 %     Weight 08/13/17 1150 88.5 kg (195 lb)     Height 08/13/17 1150 1.6 m (5\' 3" )     Head Circumference --      Peak Flow --      Pain Score 08/13/17 1149 6     Pain Loc --      Pain Edu? --      Excl. in GC? --     Constitutional: Alert and oriented. No acute distress. Pleasant and interactive Eyes: Conjunctivae are normal.  Head: Atraumatic. Nose: No  congestion/rhinnorhea. Mouth/Throat: Mucous membranes are moist.   Neck:  Painless ROM Cardiovascular: Normal rate, regular rhythm.  Good peripheral circulation. Respiratory: Normal respiratory effort.  No retractions. Gastrointestinal: Soft and nontender. No distention.  No CVA tenderness. Genitourinary: deferred Musculoskeletal: No lower extremity tenderness nor edema.  Warm and well perfused Neurologic:  Normal speech and language. No gross focal neurologic deficits are appreciated.  Skin:  Skin is warm, dry and intact. No rash noted. Psychiatric: Mood and affect are normal. Speech and behavior are normal.  ____________________________________________   LABS (all labs ordered are listed, but only abnormal results are displayed)  Labs Reviewed  COMPREHENSIVE METABOLIC PANEL - Abnormal; Notable for the following:       Result Value   Calcium 8.7 (*)    AST 14 (*)    ALT 10 (*)    All other components within normal limits  CBC  TROPONIN I   ____________________________________________  EKG  ED ECG REPORT I, Jene Every, the attending physician, personally viewed and interpreted this ECG.  Date: 08/13/2017  Rate:56 Rhythm: normal sinus rhythm QRS Axis: normal Intervals: normal ST/T Wave abnormalities: normal Narrative Interpretation: no evidence of acute ischemia  ____________________________________________  RADIOLOGY  None ____________________________________________   PROCEDURES  Procedure(s) performed: No    Critical Care performed: No ____________________________________________   INITIAL IMPRESSION / ASSESSMENT AND PLAN / ED COURSE  Pertinent labs & imaging results that were available during my care of the patient were reviewed by me and considered in my medical decision making (see chart for details).  Patient well-appearing and in no acute distress. Blood pressure is elevated likely because she has not been taking her blood pressure  medications. We will give IV Toradol, check labs and reevaluate.   Lab work is reassuring. Patient complete relief of headache after Toradol. Strongly encouraged her to start taking her medications again and she stated that she would.    ____________________________________________   FINAL CLINICAL IMPRESSION(S) / ED DIAGNOSES  Final diagnoses:  Hypertension, unspecified type  Acute nonintractable headache, unspecified headache type      NEW MEDICATIONS STARTED DURING THIS VISIT:  New Prescriptions   No medications on file     Note:  This document was prepared using Dragon voice recognition software and may include unintentional dictation errors.    Jene Every, MD 08/13/17 1524

## 2017-08-17 ENCOUNTER — Ambulatory Visit (INDEPENDENT_AMBULATORY_CARE_PROVIDER_SITE_OTHER): Payer: Commercial Managed Care - PPO | Admitting: Family Medicine

## 2017-08-17 ENCOUNTER — Encounter: Payer: Self-pay | Admitting: Family Medicine

## 2017-08-17 VITALS — BP 152/96 | HR 86 | Temp 98.1°F | Resp 16 | Wt 198.1 lb

## 2017-08-17 DIAGNOSIS — Z23 Encounter for immunization: Secondary | ICD-10-CM

## 2017-08-17 DIAGNOSIS — J302 Other seasonal allergic rhinitis: Secondary | ICD-10-CM | POA: Insufficient documentation

## 2017-08-17 DIAGNOSIS — J3089 Other allergic rhinitis: Secondary | ICD-10-CM

## 2017-08-17 DIAGNOSIS — I10 Essential (primary) hypertension: Secondary | ICD-10-CM

## 2017-08-17 MED ORDER — AMLODIPINE BESYLATE 5 MG PO TABS: ORAL_TABLET | ORAL | 0 refills | Status: DC

## 2017-08-17 MED ORDER — FLUTICASONE PROPIONATE 50 MCG/ACT NA SUSP: 2.0000 | Freq: Every day | NASAL | 2 refills | Status: DC

## 2017-08-17 NOTE — Progress Notes (Signed)
Name: Sherri Romero   MRN: [id number]    DOB: [birth year]   Date:08/17/2017       Progress Note  Subjective  Chief Complaint  Chief Complaint  Patient presents with  . Hypertension    Went to the ER due to elevated BP-233/105. ER gave her fluids and pain medication due to headaches.    HPI  Uncontrolled HTN: she states she developed nasal congestion, itchy eyes, headaches and rhinorrhea. She took some otc medication and did nto feel well. Husband checked her bp and it was 243/100's, two days later she was at work and felt dizzy, tired, paramedics checked bp and was still elevated so she was transported to [facility]. Negative troponin, normal CBC and comp panel , except for slightly low calcium. She was advised to continue home bp medications and follow up with me.   She states she is going to move to [place] in the next few months because her husband got a promotion, she states she was anxious initially but now she is ready to go. Her entire family here and she is concerned about that.    Patient Active Problem List   Diagnosis Date Noted  . Syncope 10/26/2016  . Headache disorder 10/26/2016  . Mitral valve prolapse 09/15/2016  . Colon cancer screening 09/15/2016  . Lower extremity edema 09/15/2016  . Essential hypertension 06/12/2016  . Bulge of lumbar disc without myelopathy 01/02/2015  . Neuritis or radiculitis due to rupture of lumbar intervertebral disc 01/02/2015    Past Surgical History:  Procedure Laterality Date  . CARPAL TUNNEL RELEASE    . COLONOSCOPY WITH PROPOFOL N/A 12/10/2016   Procedure: COLONOSCOPY WITH PROPOFOL;  Surgeon: Jonathon Bellows, MD;  Location[facility] ENDOSCOPY;  Service: Endoscopy;  Laterality: N/A;  . HYSTERECTOMY ABDOMINAL WITH SALPINGECTOMY    . INTERCOSTAL NERVE BLOCK    . TONSILLECTOMY AND ADENOIDECTOMY      Family History  Problem Relation Age of Onset  . Breast cancer Sister 70  . Heart disease Mother   . Kidney disease Mother 54  . Heart disease  Father   . Stroke Father 82    Social History   Social History  . Marital status: Married    Spouse name: N/A  . Number of children: N/A  . Years of education: N/A   Occupational History  . Not on file.   Social History Main Topics  . Smoking status: Never Smoker  . Smokeless tobacco: Never Used  . Alcohol use No  . Drug use: No  . Sexual activity: Yes   Other Topics Concern  . Not on file   Social History Narrative   Married, they have two teenagers ( a boy and a son)   They are moving to [place], husband got a promotion with [employer].      Current Outpatient Prescriptions:  .  amLODipine (NORVASC) 5 MG tablet, One daily, Disp: 30 tablet, Rfl: 0 .  atorvastatin (LIPITOR) 10 MG tablet, Take 1 tablet by mouth daily., Disp: , Rfl:  .  hydrochlorothiazide (HYDRODIURIL) 25 MG tablet, Take 1 tablet (25 mg total) by mouth daily. Please make sure she gets 25 mg and not 12.5 mg sent earlier today, Disp: 90 tablet, Rfl: 0 .  HYDROcodone-acetaminophen (NORCO) 10-325 MG tablet, Take 1 tablet by mouth daily as needed., Disp: , Rfl:  .  nortriptyline (PAMELOR) 10 MG capsule, Take 1-2 capsules by mouth at bedtime., Disp: , Rfl:   Allergies  Allergen Reactions  .  Penicillins Anaphylaxis and Shortness Of Breath  . Cephalosporins     Other reaction(s): Unknown     ROS  Constitutional: Negative for fever or weight change.  Respiratory: Negative for cough and shortness of breath.   Cardiovascular: Negative for chest pain or palpitations.  Gastrointestinal: Negative for abdominal pain, no bowel changes.  Musculoskeletal: Negative for gait problem or joint swelling.  Skin: Negative for rash.  Neurological: Negative for dizziness ( resolved ) , positive for  headache.  No other specific complaints in a complete review of systems (except as listed in HPI above).  Objective  Vitals:   08/17/17 1532  BP: (!) 152/96  Pulse: 86  Resp: 16  Temp: 98.1 F (36.7 C)  TempSrc: Oral   SpO2: 98%  Weight: 198 lb 1.6 oz (89.9 kg)    Body mass index is 35.09 kg/m.  Physical Exam  Constitutional: Patient appears well-developed and well-nourished. Obese No distress.  HEENT: head atraumatic, normocephalic, pupils equal and reactive to light, neck supple, throat within normal limits, boggy turbinates Cardiovascular: Normal rate, regular rhythm and normal heart sounds.  No murmur heard. No BLE edema. Pulmonary/Chest: Effort normal and breath sounds normal. No respiratory distress. Abdominal: Soft.  There is no tenderness. Psychiatric: Patient has a normal mood and affect. behavior is normal. Judgment and thought content normal. Neurological exam: normal cranial nerves, normal sensation, romberg negative  Recent Results (from the past 2160 hour(s))  CBC     Status: None   Collection Time: 08/13/17 12:08 PM  Result Value Ref Range   WBC 7.1 3.6 - 11.0 K/uL   RBC 4.11 3.80 - 5.20 MIL/uL   Hemoglobin 13.2 12.0 - 16.0 g/dL   HCT 38.9 35.0 - 47.0 %   MCV 94.7 80.0 - 100.0 fL   MCH 32.3 26.0 - 34.0 pg   MCHC 34.1 32.0 - 36.0 g/dL   RDW 14.3 11.5 - 14.5 %   Platelets 274 150 - 440 K/uL  Comprehensive metabolic panel     Status: Abnormal   Collection Time: 08/13/17 12:08 PM  Result Value Ref Range   Sodium 139 135 - 145 mmol/L   Potassium 3.8 3.5 - 5.1 mmol/L   Chloride 105 101 - 111 mmol/L   CO2 28 22 - 32 mmol/L   Glucose, Bld 95 65 - 99 mg/dL   BUN 8 6 - 20 mg/dL   Creatinine, Ser 0.97 0.44 - 1.00 mg/dL   Calcium 8.7 (L) 8.9 - 10.3 mg/dL   Total Protein 7.0 6.5 - 8.1 g/dL   Albumin 3.6 3.5 - 5.0 g/dL   AST 14 (L) 15 - 41 U/L   ALT 10 (L) 14 - 54 U/L   Alkaline Phosphatase 57 38 - 126 U/L   Total Bilirubin 0.7 0.3 - 1.2 mg/dL   GFR calc non Af Amer >60 >60 mL/min   GFR calc Af Amer >60 >60 mL/min    Comment: (NOTE) The eGFR has been calculated using the CKD EPI equation. This calculation has not been validated in all clinical situations. eGFR's persistently  <60 mL/min signify possible Chronic Kidney Disease.    Anion gap 6 5 - 15  Troponin I     Status: None   Collection Time: 08/13/17 12:08 PM  Result Value Ref Range   Troponin I <0.03 <0.03 ng/mL     PHQ2/9: Depression screen [facility] 2/9 08/17/2017 11/25/2016 09/15/2016 06/12/2016  Decreased Interest 0 0 0 0  Down, Depressed, Hopeless 0 0 0 0  PHQ - 2 Score 0 0 0 0    Fall Risk: Fall Risk  08/17/2017 11/25/2016 09/15/2016 06/12/2016  Falls in the past year? No No No No     Functional Status Survey: Is the patient deaf or have difficulty hearing?: No Does the patient have difficulty seeing, even when wearing glasses/contacts?: No Does the patient have difficulty concentrating, remembering, or making decisions?: No Does the patient have difficulty walking or climbing stairs?: No Does the patient have difficulty dressing or bathing?: No Does the patient have difficulty doing errands alone such as visiting a doctor's office or shopping?: No    Assessment & Plan  1. Uncontrolled hypertension  - amLODipine (NORVASC) 5 MG tablet; One daily  Dispense: 30 tablet; Refill: 0 bp has spiked last week, she was having nasal congestion and took otc medication, not sure if it could have been the cause, we will increase dose of Norvasc from 2.5 to 5 mg and recheck in one week .  2. Needs flu shot  - Flu Vaccine QUAD 36+ mos IM  3. Seasonal allergic rhinitis due to other allergic trigger  - fluticasone (FLONASE) 50 MCG/ACT nasal spray; Place 2 sprays into both nostrils daily.  Dispense: 16 g; Refill: 2

## 2017-08-18 ENCOUNTER — Ambulatory Visit: Payer: Managed Care, Other (non HMO) | Admitting: Family Medicine

## 2017-08-20 ENCOUNTER — Ambulatory Visit (INDEPENDENT_AMBULATORY_CARE_PROVIDER_SITE_OTHER): Payer: Commercial Managed Care - PPO | Admitting: Family Medicine

## 2017-08-20 ENCOUNTER — Encounter: Payer: Self-pay | Admitting: Family Medicine

## 2017-08-20 VITALS — BP 142/78 | HR 74 | Temp 98.2°F | Resp 16 | Ht 63.0 in | Wt 195.1 lb

## 2017-08-20 DIAGNOSIS — I10 Essential (primary) hypertension: Secondary | ICD-10-CM | POA: Diagnosis not present

## 2017-08-20 MED ORDER — OLMESARTAN MEDOXOMIL-HCTZ 40-25 MG PO TABS: 1.0000 | ORAL_TABLET | Freq: Every day | ORAL | 0 refills | Status: DC

## 2017-08-20 NOTE — Progress Notes (Signed)
Name: Sherri Romero   MRN: [id number]    DOB: Apr 16, 1966   Date:08/20/2017       Progress Note  Subjective  Chief Complaint  Chief Complaint  Patient presents with  . Hypertension    pt has been keeping a log and checking 3 times a day    HPI  HTN: she is now on Norvasc and HCTZ 25 mg, and bp at home is trending down however still above 140/90. At times going to 180. She denies headache, nasal congestion improved with nasal spray, resting has helped her feel better, she thinks she is ready to return to work on Monday. No palpitation, no chest pain, nausea , vomiting. .    Patient Active Problem List   Diagnosis Date Noted  . Allergic rhinitis, seasonal 08/17/2017  . Syncope 10/26/2016  . Headache disorder 10/26/2016  . Mitral valve prolapse 09/15/2016  . Colon cancer screening 09/15/2016  . Lower extremity edema 09/15/2016  . Essential hypertension 06/12/2016  . Bulge of lumbar disc without myelopathy 01/02/2015  . Neuritis or radiculitis due to rupture of lumbar intervertebral disc 01/02/2015    Past Surgical History:  Procedure Laterality Date  . CARPAL TUNNEL RELEASE    . COLONOSCOPY WITH PROPOFOL N/A 12/10/2016   Procedure: COLONOSCOPY WITH PROPOFOL;  Surgeon: Jonathon Bellows, MD;  Location: [facility] ENDOSCOPY;  Service: Endoscopy;  Laterality: N/A;  . HYSTERECTOMY ABDOMINAL WITH SALPINGECTOMY    . INTERCOSTAL NERVE BLOCK    . TONSILLECTOMY AND ADENOIDECTOMY      Family History  Problem Relation Age of Onset  . Breast cancer Sister 70  . Heart disease Mother   . Kidney disease Mother 56  . Heart disease Father   . Stroke Father 90    Social History   Social History  . Marital status: Married    Spouse name: N/A  . Number of children: N/A  . Years of education: N/A   Occupational History  . Not on file.   Social History Main Topics  . Smoking status: Never Smoker  . Smokeless tobacco: Never Used  . Alcohol use No  . Drug use: No  . Sexual activity: Yes    Other Topics Concern  . Not on file   Social History Narrative   Married, they have two teenagers ( a boy and a son)   They are moving to [place], husband got a promotion with [employer].      Current Outpatient Prescriptions:  .  amLODipine (NORVASC) 5 MG tablet, One daily, Disp: 30 tablet, Rfl: 0 .  atorvastatin (LIPITOR) 10 MG tablet, Take 1 tablet by mouth daily., Disp: , Rfl:  .  fluticasone (FLONASE) 50 MCG/ACT nasal spray, Place 2 sprays into both nostrils daily., Disp: 16 g, Rfl: 2 .  HYDROcodone-acetaminophen (NORCO) 10-325 MG tablet, Take 1 tablet by mouth daily as needed., Disp: , Rfl:  .  nortriptyline (PAMELOR) 10 MG capsule, Take 1-2 capsules by mouth at bedtime., Disp: , Rfl:  .  olmesartan-hydrochlorothiazide (BENICAR HCT) 40-25 MG tablet, Take 1 tablet by mouth daily., Disp: 30 tablet, Rfl: 0  Allergies  Allergen Reactions  . Penicillins Anaphylaxis and Shortness Of Breath  . Cephalosporins     Other reaction(s): Unknown     ROS  Ten systems reviewed and is negative except as mentioned in HPI   Objective  Vitals:   08/20/17 0828  BP: (!) 166/82  Pulse: 74  Resp: 16  Temp: 98.2 F (36.8 C)  SpO2: 98%  Weight: 195 lb 2 oz (88.5 kg)  Height: _0  (1.6 m)    Body mass index is 34.56 kg/m.  Physical Exam  Constitutional: Patient appears well-developed and well-nourished. Obese  No distress.  HEENT: head atraumatic, normocephalic, pupils equal and reactive to light,  neck supple, throat within normal limits Cardiovascular: Normal rate, regular rhythm and normal heart sounds.  No murmur heard. No BLE edema. Pulmonary/Chest: Effort normal and breath sounds normal. No respiratory distress. Abdominal: Soft.  There is no tenderness. Psychiatric: Patient has a normal mood and affect. behavior is normal. Judgment and thought content normal.  Recent Results (from the past 2160 hour(s))  CBC     Status: None   Collection Time: 08/13/17 12:08 PM  Result  Value Ref Range   WBC 7.1 3.6 - 11.0 K/uL   RBC 4.11 3.80 - 5.20 MIL/uL   Hemoglobin 13.2 12.0 - 16.0 g/dL   HCT 38.9 35.0 - 47.0 %   MCV 94.7 80.0 - 100.0 fL   MCH 32.3 26.0 - 34.0 pg   MCHC 34.1 32.0 - 36.0 g/dL   RDW 14.3 11.5 - 14.5 %   Platelets 274 150 - 440 K/uL  Comprehensive metabolic panel     Status: Abnormal   Collection Time: 08/13/17 12:08 PM  Result Value Ref Range   Sodium 139 135 - 145 mmol/L   Potassium 3.8 3.5 - 5.1 mmol/L   Chloride 105 101 - 111 mmol/L   CO2 28 22 - 32 mmol/L   Glucose, Bld 95 65 - 99 mg/dL   BUN 8 6 - 20 mg/dL   Creatinine, Ser 0.97 0.44 - 1.00 mg/dL   Calcium 8.7 (L) 8.9 - 10.3 mg/dL   Total Protein 7.0 6.5 - 8.1 g/dL   Albumin 3.6 3.5 - 5.0 g/dL   AST 14 (L) 15 - 41 U/L   ALT 10 (L) 14 - 54 U/L   Alkaline Phosphatase 57 38 - 126 U/L   Total Bilirubin 0.7 0.3 - 1.2 mg/dL   GFR calc non Af Amer >60 >60 mL/min   GFR calc Af Amer >60 >60 mL/min    Comment: (NOTE) The eGFR has been calculated using the CKD EPI equation. This calculation has not been validated in all clinical situations. eGFR's persistently <60 mL/min signify possible Chronic Kidney Disease.    Anion gap 6 5 - 15  Troponin I     Status: None   Collection Time: 08/13/17 12:08 PM  Result Value Ref Range   Troponin I <0.03 <0.03 ng/mL     PHQ2/9: Depression screen [facility] 2/9 08/17/2017 11/25/2016 09/15/2016 06/12/2016  Decreased Interest 0 0 0 0  Down, Depressed, Hopeless 0 0 0 0  PHQ - 2 Score 0 0 0 0     Fall Risk: Fall Risk  08/17/2017 11/25/2016 09/15/2016 06/12/2016  Falls in the past year? No No No No     Assessment & Plan  1. Uncontrolled hypertension  - olmesartan-hydrochlorothiazide (BENICAR HCT) 40-25 MG tablet; Take 1 tablet by mouth daily.  Dispense: 30 tablet; Refill: 0  Continue Norvasc, explained she may get dizzy if bp drops more than 10 points, to stay hydrated, take it easy and let us know if not able to return to work on Monday. May take half pill  of Benicar HCTZ if bp drops below 110/70

## 2017-09-10 ENCOUNTER — Telehealth: Payer: Self-pay

## 2017-09-10 DIAGNOSIS — I10 Essential (primary) hypertension: Secondary | ICD-10-CM

## 2017-09-10 DIAGNOSIS — J3089 Other allergic rhinitis: Secondary | ICD-10-CM

## 2017-09-10 NOTE — Telephone Encounter (Signed)
Patient is requesting 90 day supply of medications to be sent to [facility].  Last office visit: 08/20/2017

## 2017-09-13 ENCOUNTER — Other Ambulatory Visit: Payer: Self-pay

## 2017-09-13 ENCOUNTER — Telehealth: Payer: Self-pay | Admitting: Family Medicine

## 2017-09-13 DIAGNOSIS — I10 Essential (primary) hypertension: Secondary | ICD-10-CM

## 2017-09-13 DIAGNOSIS — J3089 Other allergic rhinitis: Secondary | ICD-10-CM

## 2017-09-13 NOTE — Telephone Encounter (Signed)
Spoke with patient, she is going to keep her Oct 8 appt

## 2017-09-13 NOTE — Telephone Encounter (Addendum)
Pharmacy requesting refill of Amlodipine 5 mg, patient has an appointment upcoming on 09/20/17. Also [facility] is requesting 90 day refill of Flonase.

## 2017-09-13 NOTE — Telephone Encounter (Signed)
Patient is already scheduled to come in on 09/20/17 for a follow up visit.  She unable to come in this week.

## 2017-09-14 ENCOUNTER — Other Ambulatory Visit: Payer: Self-pay

## 2017-09-14 DIAGNOSIS — I10 Essential (primary) hypertension: Secondary | ICD-10-CM

## 2017-09-14 MED ORDER — AMLODIPINE BESYLATE 5 MG PO TABS: ORAL_TABLET | ORAL | 0 refills | Status: AC

## 2017-09-14 MED ORDER — OLMESARTAN MEDOXOMIL-HCTZ 40-25 MG PO TABS: 1.0000 | ORAL_TABLET | Freq: Every day | ORAL | 0 refills | Status: DC

## 2017-09-14 MED ORDER — FLUTICASONE PROPIONATE 50 MCG/ACT NA SUSP: 2.0000 | Freq: Every day | NASAL | 2 refills | Status: AC

## 2017-09-14 NOTE — Telephone Encounter (Signed)
Patient requesting refill of Olmesartan-HCTZ to [facility] for a 90 day supply.

## 2017-09-16 ENCOUNTER — Other Ambulatory Visit: Payer: Self-pay | Admitting: Family Medicine

## 2017-09-16 DIAGNOSIS — I10 Essential (primary) hypertension: Secondary | ICD-10-CM

## 2017-09-20 ENCOUNTER — Encounter: Payer: Self-pay | Admitting: Family Medicine

## 2017-09-20 ENCOUNTER — Ambulatory Visit (INDEPENDENT_AMBULATORY_CARE_PROVIDER_SITE_OTHER): Payer: Commercial Managed Care - PPO | Admitting: Family Medicine

## 2017-09-20 VITALS — BP 136/76 | HR 64 | Temp 97.9°F | Resp 16 | Ht 63.0 in | Wt 197.9 lb

## 2017-09-20 DIAGNOSIS — R21 Rash and other nonspecific skin eruption: Secondary | ICD-10-CM

## 2017-09-20 DIAGNOSIS — I10 Essential (primary) hypertension: Secondary | ICD-10-CM

## 2017-09-20 MED ORDER — OLMESARTAN MEDOXOMIL-HCTZ 40-25 MG PO TABS: 1.0000 | ORAL_TABLET | Freq: Every day | ORAL | 0 refills | Status: DC

## 2017-09-20 NOTE — Progress Notes (Signed)
Name: Sherri Romero   MRN: [id number]    DOB: 1966/11/03   Date:09/20/2017       Progress Note  Subjective  Chief Complaint  Chief Complaint  Patient presents with  . Follow-up    1 month F/U  . Hypertension    Doing well with new medication, headaches have gone away. Still has edema in ankles, BP at home has been running around 125/89's  . Rash    Onset-1 week, around belly button, itchy, little bumps all over.     HPI   HTN: she is now on Norvasc , Benicar 40, HCTZ 25 mg, bp at home has improved down to 120-130's but elevated again when she arrived to our office.  BP no longer spiking to 180. Marland Kitchen She denies headache, chest pain or palpitation. She has a long history of lower extremity edema, wearing compression stocking hoses and elevating legs when possible ( going on for 14 years since her daughter was born - swelling of legs)   Rash: she noticed a rash on her peri-umbilical area that was itchy, symptoms started about 2 weeks ago, she has been using otc topical medication and it has dried up, no itching anymore. The medication was for right worm   Patient Active Problem List   Diagnosis Date Noted  . Allergic rhinitis, seasonal 08/17/2017  . Syncope 10/26/2016  . Headache disorder 10/26/2016  . Mitral valve prolapse 09/15/2016  . Colon cancer screening 09/15/2016  . Lower extremity edema 09/15/2016  . Essential hypertension 06/12/2016  . Bulge of lumbar disc without myelopathy 01/02/2015  . Neuritis or radiculitis due to rupture of lumbar intervertebral disc 01/02/2015    Past Surgical History:  Procedure Laterality Date  . CARPAL TUNNEL RELEASE    . COLONOSCOPY WITH PROPOFOL N/A 12/10/2016   Procedure: COLONOSCOPY WITH PROPOFOL;  Surgeon: Jonathon Bellows, MD;  Location: [facility] ENDOSCOPY;  Service: Endoscopy;  Laterality: N/A;  . HYSTERECTOMY ABDOMINAL WITH SALPINGECTOMY    . INTERCOSTAL NERVE BLOCK    . TONSILLECTOMY AND ADENOIDECTOMY      Family History  Problem  Relation Age of Onset  . Breast cancer Sister 11  . Heart disease Mother   . Kidney disease Mother 19  . Heart disease Father   . Stroke Father 34    Social History   Social History  . Marital status: Married    Spouse name: N/A  . Number of children: N/A  . Years of education: N/A   Occupational History  . Not on file.   Social History Main Topics  . Smoking status: Never Smoker  . Smokeless tobacco: Never Used  . Alcohol use No  . Drug use: No  . Sexual activity: Yes   Other Topics Concern  . Not on file   Social History Narrative   Married, they have two teenagers ( a boy and a son)   They are moving to [place], husband got a promotion with [employer].      Current Outpatient Prescriptions:  .  amLODipine (NORVASC) 5 MG tablet, One daily, Disp: 30 tablet, Rfl: 0 .  atorvastatin (LIPITOR) 10 MG tablet, Take 1 tablet by mouth daily., Disp: , Rfl:  .  fluticasone (FLONASE) 50 MCG/ACT nasal spray, Place 2 sprays into both nostrils daily., Disp: 16 g, Rfl: 2 .  HYDROcodone-acetaminophen (NORCO) 10-325 MG tablet, Take 1 tablet by mouth daily as needed., Disp: , Rfl:  .  nortriptyline (PAMELOR) 10 MG capsule, Take 1-2 capsules by mouth  at bedtime., Disp: , Rfl:  .  olmesartan-hydrochlorothiazide (BENICAR HCT) 40-25 MG tablet, Take 1 tablet by mouth daily., Disp: 90 tablet, Rfl: 0  Allergies  Allergen Reactions  . Penicillins Anaphylaxis and Shortness Of Breath  . Cephalosporins     Other reaction(s): Unknown     ROS  Ten systems reviewed and is negative except as mentioned in HPI   Objective  Vitals:   09/20/17 1004  BP: (!) 152/78  Pulse: 64  Resp: 16  Temp: 97.9 F (36.6 C)  TempSrc: Oral  SpO2: 98%  Weight: 197 lb 14.4 oz (89.8 kg)  Height: _0  (1.6 m)    Body mass index is 35.06 kg/m.  Physical Exam  Constitutional: Patient appears well-developed and well-nourished. Obese  No distress.  HEENT: head atraumatic, normocephalic, pupils equal and  reactive to light,  neck supple, throat within normal limits Cardiovascular: Normal rate, regular rhythm and normal heart sounds.  No murmur heard. No BLE edema. Pulmonary/Chest: Effort normal and breath sounds normal. No respiratory distress. Abdominal: Soft.  There is no tenderness. Skin: hyperpigmentation of periumbilical area, rash seems to be healing Psychiatric: Patient has a normal mood and affect. behavior is normal. Judgment and thought content normal.  Recent Results (from the past 2160 hour(s))  CBC     Status: None   Collection Time: 08/13/17 12:08 PM  Result Value Ref Range   WBC 7.1 3.6 - 11.0 K/uL   RBC 4.11 3.80 - 5.20 MIL/uL   Hemoglobin 13.2 12.0 - 16.0 g/dL   HCT 38.9 35.0 - 47.0 %   MCV 94.7 80.0 - 100.0 fL   MCH 32.3 26.0 - 34.0 pg   MCHC 34.1 32.0 - 36.0 g/dL   RDW 14.3 11.5 - 14.5 %   Platelets 274 150 - 440 K/uL  Comprehensive metabolic panel     Status: Abnormal   Collection Time: 08/13/17 12:08 PM  Result Value Ref Range   Sodium 139 135 - 145 mmol/L   Potassium 3.8 3.5 - 5.1 mmol/L   Chloride 105 101 - 111 mmol/L   CO2 28 22 - 32 mmol/L   Glucose, Bld 95 65 - 99 mg/dL   BUN 8 6 - 20 mg/dL   Creatinine, Ser 0.97 0.44 - 1.00 mg/dL   Calcium 8.7 (L) 8.9 - 10.3 mg/dL   Total Protein 7.0 6.5 - 8.1 g/dL   Albumin 3.6 3.5 - 5.0 g/dL   AST 14 (L) 15 - 41 U/L   ALT 10 (L) 14 - 54 U/L   Alkaline Phosphatase 57 38 - 126 U/L   Total Bilirubin 0.7 0.3 - 1.2 mg/dL   GFR calc non Af Amer >60 >60 mL/min   GFR calc Af Amer >60 >60 mL/min    Comment: (NOTE) The eGFR has been calculated using the CKD EPI equation. This calculation has not been validated in all clinical situations. eGFR's persistently <60 mL/min signify possible Chronic Kidney Disease.    Anion gap 6 5 - 15  Troponin I     Status: None   Collection Time: 08/13/17 12:08 PM  Result Value Ref Range   Troponin I <0.03 <0.03 ng/mL     PHQ2/9: Depression screen [facility] 2/9 08/17/2017 11/25/2016  09/15/2016 06/12/2016  Decreased Interest 0 0 0 0  Down, Depressed, Hopeless 0 0 0 0  PHQ - 2 Score 0 0 0 0     Fall Risk: Fall Risk  08/17/2017 11/25/2016 09/15/2016 06/12/2016  Falls in the past year?  No No No No     Assessment & Plan  1. Uncontrolled hypertension  Going on for a while now, with episodes that bp goes higher, advised follow up with nephrologist  - olmesartan-hydrochlorothiazide (BENICAR HCT) 40-25 MG tablet; Take 1 tablet by mouth daily.  Dispense: 90 tablet; Refill: 0 - Ambulatory referral to Nephrology  2. Rash  resolving

## 2017-11-01 ENCOUNTER — Encounter (INDEPENDENT_AMBULATORY_CARE_PROVIDER_SITE_OTHER): Payer: Self-pay

## 2017-11-17 ENCOUNTER — Other Ambulatory Visit (INDEPENDENT_AMBULATORY_CARE_PROVIDER_SITE_OTHER): Payer: Self-pay | Admitting: Nephrology

## 2017-11-17 ENCOUNTER — Other Ambulatory Visit (INDEPENDENT_AMBULATORY_CARE_PROVIDER_SITE_OTHER): Payer: Commercial Managed Care - PPO

## 2017-11-17 DIAGNOSIS — I10 Essential (primary) hypertension: Secondary | ICD-10-CM | POA: Diagnosis not present

## 2017-11-26 ENCOUNTER — Encounter: Payer: Commercial Managed Care - PPO | Admitting: Family Medicine

## 2017-12-13 ENCOUNTER — Other Ambulatory Visit: Payer: Self-pay

## 2017-12-13 DIAGNOSIS — I10 Essential (primary) hypertension: Secondary | ICD-10-CM

## 2017-12-13 NOTE — Telephone Encounter (Signed)
Refill request for Hypertension medication:  Benicar HCT 40-25 mg  Last office visit pertaining to hypertension: 09/20/2017  Follow up visit: None indicated  BP Readings from Last 3 Encounters:  09/20/17 136/76  08/20/17 (!) 142/78  08/17/17 (!) 152/96     Lab Results  Component Value Date   CREATININE 0.97 08/13/2017   BUN 8 08/13/2017   NA 139 08/13/2017   K 3.8 08/13/2017   CL 105 08/13/2017   CO2 28 08/13/2017

## 2017-12-14 MED ORDER — OLMESARTAN MEDOXOMIL-HCTZ 40-25 MG PO TABS: 1.0000 | ORAL_TABLET | Freq: Every day | ORAL | 0 refills | Status: AC
